# Patient Record
Sex: Male | Born: 1965 | Race: Black or African American | Hispanic: No | Marital: Single | Smoking: Never smoker
Health system: Southern US, Community
[De-identification: ages and names within clinical notes are randomized; demographics above are authoritative.]

## PROBLEM LIST (undated history)

## (undated) DIAGNOSIS — E785 Hyperlipidemia, unspecified: Secondary | ICD-10-CM

## (undated) DIAGNOSIS — E669 Obesity, unspecified: Secondary | ICD-10-CM

## (undated) DIAGNOSIS — I10 Essential (primary) hypertension: Secondary | ICD-10-CM

## (undated) HISTORY — PX: OTHER SURGICAL HISTORY: SHX169

## (undated) HISTORY — DX: Hyperlipidemia, unspecified: E78.5

## (undated) HISTORY — PX: EYE SURGERY: SHX253

## (undated) HISTORY — PX: APPENDECTOMY: SHX54

## (undated) HISTORY — DX: Essential (primary) hypertension: I10

## (undated) HISTORY — DX: Obesity, unspecified: E66.9

---

## 2003-09-06 ENCOUNTER — Emergency Department (HOSPITAL_COMMUNITY): Admission: EM | Admit: 2003-09-06 | Discharge: 2003-09-06 | Payer: Self-pay | Admitting: Emergency Medicine

## 2003-09-08 ENCOUNTER — Ambulatory Visit (HOSPITAL_COMMUNITY): Admission: RE | Admit: 2003-09-08 | Discharge: 2003-09-08 | Payer: Self-pay | Admitting: Orthopedic Surgery

## 2009-05-24 ENCOUNTER — Encounter: Admission: RE | Admit: 2009-05-24 | Discharge: 2009-07-03 | Payer: Self-pay | Admitting: Orthopedic Surgery

## 2009-09-18 ENCOUNTER — Ambulatory Visit: Payer: Self-pay | Admitting: Family Medicine

## 2009-09-18 DIAGNOSIS — I10 Essential (primary) hypertension: Secondary | ICD-10-CM | POA: Insufficient documentation

## 2009-09-19 DIAGNOSIS — E785 Hyperlipidemia, unspecified: Secondary | ICD-10-CM | POA: Insufficient documentation

## 2009-09-20 LAB — CONVERTED CEMR LAB
ALT: 29 units/L (ref 0–53)
AST: 23 units/L (ref 0–37)
Albumin: 4.5 g/dL (ref 3.5–5.2)
Alkaline Phosphatase: 60 units/L (ref 39–117)
BUN: 9 mg/dL (ref 6–23)
CO2: 26 meq/L (ref 19–32)
Calcium: 9.5 mg/dL (ref 8.4–10.5)
Chloride: 102 meq/L (ref 96–112)
Cholesterol: 266 mg/dL — ABNORMAL HIGH (ref 0–200)
Creatinine, Ser: 0.98 mg/dL (ref 0.40–1.50)
Glucose, Bld: 91 mg/dL (ref 70–99)
HDL: 32 mg/dL — ABNORMAL LOW (ref >39)
LDL Cholesterol: 182 mg/dL — ABNORMAL HIGH (ref 0–99)
Potassium: 4.3 meq/L (ref 3.5–5.3)
Sodium: 140 meq/L (ref 135–145)
Total Bilirubin: 0.6 mg/dL (ref 0.3–1.2)
Total CHOL/HDL Ratio: 8.3
Total Protein: 7.2 g/dL (ref 6.0–8.3)
Triglycerides: 260 mg/dL — ABNORMAL HIGH (ref <150)
VLDL: 52 mg/dL — ABNORMAL HIGH (ref 0–40)

## 2009-10-13 ENCOUNTER — Telehealth: Payer: Self-pay | Admitting: Family Medicine

## 2009-12-25 ENCOUNTER — Ambulatory Visit: Payer: Self-pay | Admitting: Family Medicine

## 2009-12-26 LAB — CONVERTED CEMR LAB
ALT: 23 units/L (ref 0–53)
AST: 32 units/L (ref 0–37)
Direct LDL: 117 mg/dL — ABNORMAL HIGH

## 2010-01-08 ENCOUNTER — Ambulatory Visit: Payer: Self-pay | Admitting: Family Medicine

## 2010-03-26 ENCOUNTER — Ambulatory Visit: Payer: Self-pay | Admitting: Family Medicine

## 2010-03-26 DIAGNOSIS — K644 Residual hemorrhoidal skin tags: Secondary | ICD-10-CM | POA: Insufficient documentation

## 2010-04-16 ENCOUNTER — Ambulatory Visit: Payer: Self-pay | Admitting: Family Medicine

## 2010-04-17 LAB — CONVERTED CEMR LAB
ALT: 22 units/L (ref 0–53)
AST: 19 units/L (ref 0–37)
Albumin: 4.7 g/dL (ref 3.5–5.2)
Alkaline Phosphatase: 53 units/L (ref 39–117)
BUN: 11 mg/dL (ref 6–23)
CO2: 25 meq/L (ref 19–32)
Calcium: 9.8 mg/dL (ref 8.4–10.5)
Chloride: 104 meq/L (ref 96–112)
Cholesterol: 227 mg/dL — ABNORMAL HIGH (ref 0–200)
Creatinine, Ser: 0.86 mg/dL (ref 0.40–1.50)
Glucose, Bld: 97 mg/dL (ref 70–99)
HDL: 40 mg/dL (ref >39)
LDL Cholesterol: 166 mg/dL — ABNORMAL HIGH (ref 0–99)
Potassium: 4.7 meq/L (ref 3.5–5.3)
Sodium: 140 meq/L (ref 135–145)
Total Bilirubin: 0.7 mg/dL (ref 0.3–1.2)
Total CHOL/HDL Ratio: 5.7
Total Protein: 7.6 g/dL (ref 6.0–8.3)
Triglycerides: 106 mg/dL (ref <150)
VLDL: 21 mg/dL (ref 0–40)

## 2010-07-02 ENCOUNTER — Telehealth: Payer: Self-pay | Admitting: Family Medicine

## 2010-09-11 ENCOUNTER — Telehealth: Payer: Self-pay | Admitting: Family Medicine

## 2010-10-05 ENCOUNTER — Telehealth (INDEPENDENT_AMBULATORY_CARE_PROVIDER_SITE_OTHER): Payer: Self-pay | Admitting: *Deleted

## 2010-10-11 NOTE — Assessment & Plan Note (Signed)
Summary: HTN/ high chol   Vital Signs:  Patient profile:   45 year old male Height:      73 inches Weight:      292 pounds BMI:     38.66 O2 Sat:      98 % on Room air Pulse rate:   67 / minute BP sitting:   151 / 91  (left arm) Cuff size:   large  Vitals Entered By: Payton Spark CMA (December 25, 2009 9:14 AM)  O2 Flow:  Room air CC: F/U BP.    Primary Care Provider:  Seymour Bars DO  CC:  F/U BP. Marland Kitchen  History of Present Illness: 43 yr BM presents for F/U HTN and Hyperlipidemia.  He has lost 10 lbs since 09/2009. He is working out 2hrs/day 5 days/wk with a Psychologist, educational. He is eating a high protein, low carb diet. He is taking Norvasc and Simvastatin almost daily. He forgot Norvasc today. He is taking a caffeine supplement daily before he exercises.  Denies CP, SOB. No HA, N/V.   He is due for f/u labs with new start statin 3 mos ago.     Current Medications (verified): 1)  Simvastatin 20 Mg Tabs (Simvastatin) .Marland Kitchen.. 1 Tab By Mouth Qhs 2)  Norvasc 5 Mg Tabs (Amlodipine Besylate) .Marland Kitchen.. 1 Tab By Mouth Daily 3)  Oxy Elite Pro  Allergies (verified): No Known Drug Allergies  Past History:  Past Medical History: HTN obesity high cholesterol  Past Surgical History: Reviewed history from 09/18/2009 and no changes required. appy at age 88 patellar tendon surgery, bilat  ortho Dr Charise Killian  Family History: Reviewed history from 09/18/2009 and no changes required. father died of pancreatic cancer at 65 mother healthy brother HTN uncle ESRD, DM cousin DM  Social History: Reviewed history from 09/18/2009 and no changes required. 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 Married with a 90 yo daughter. Never smoked.  Denies ETOH. Wants to start exercising.  Review of Systems      See HPI  Physical Exam  General:  alert, well-developed, well-nourished, and well-hydrated.   Head:  normocephalic and atraumatic.   Eyes:  pupils equal, pupils round, and pupils reactive to  light.   Nose:  no nasal discharge.   Mouth:  good dentition and pharynx pink and moist.   Neck:  no masses.   Lungs:  Normal respiratory effort, chest expands symmetrically. Lungs are clear to auscultation, no crackles or wheezes. Heart:  normal rate, regular rhythm, and no murmur.   Pulses:  R radial normal and L radial normal.   Extremities:  no LE edema Skin:  turgor normal, color normal, and no rashes.   Psych:  normally interactive, good eye contact, not anxious appearing, and not depressed appearing.     Impression & Recommendations:  Problem # 1:  ESSENTIAL HYPERTENSION, BENIGN (ICD-401.1)  BP today 151/91 (off meds). Reinforced take Norvasc everyday. Nurse visit in 2 weeks to check BP.  F/u 4 months.  His updated medication list for this problem includes:    Norvasc 5 Mg Tabs (Amlodipine besylate) .Marland Kitchen... 1 tab by mouth daily  BP today: 151/91 Prior BP: 143/84 (09/18/2009)  Labs Reviewed: K+: 4.3 (09/18/2009) Creat: : 0.98 (09/18/2009)   Chol: 266 (09/18/2009)   HDL: 32 (09/18/2009)   LDL: 182 (09/18/2009)   TG: 260 (09/18/2009)  Problem # 2:  HYPERLIPIDEMIA (ICD-272.4)  Labs today, will call results tomorrow.  Continuce Simvastatin daily at night.  Keep working on wt  loss, healthy diet and exercise.  10# wt loss is a great start!  His updated medication list for this problem includes:    Simvastatin 20 Mg Tabs (Simvastatin) .Marland Kitchen... 1 tab by mouth qhs  Orders: T-ALT/SGPT ([address]) T-AST/SGOT ([address]-91478) T-Lipoprotein (LDL cholesterol)  ([address]-13086)  Complete Medication List: 1)  Simvastatin 20 Mg Tabs (Simvastatin) .Marland Kitchen.. 1 tab by mouth qhs 2)  Norvasc 5 Mg Tabs (Amlodipine besylate) .Marland Kitchen.. 1 tab by mouth daily 3)  Oxy Elite Pro   Patient Instructions: 1)  Return in 2 wks for a nurse BP check. 2)  Take Norvasc 5 mg every NIGHT with the Simvastatin at NIGHT. 3)  Labs today. 4)  Will call you w/ results tomorrow and will adjust cholesterol medicine if  needed. 5)  Keep working on [employer] and exercise. 6)  Follow up in 4 mos.

## 2010-10-11 NOTE — Assessment & Plan Note (Signed)
Summary: BP check  Nurse Visit   Vital Signs:  Patient profile:   45 year old male O2 Sat:      99 % on Room air Pulse rate:   62 / minute BP sitting:   157 / 98  (right arm) Cuff size:   large  Vitals Entered By: Payton Spark CMA (Jan 08, 2010 9:16 AM)  O2 Flow:  Room air  Allergies: No Known Drug Allergies  Orders Added: 1)  Est. Patient Level I [[address]] Prescriptions: NORVASC 10 MG TABS (AMLODIPINE BESYLATE) 1 tab by mouth daily  #30 x 1   Entered and Authorized by:   Seymour Bars DO   Signed by:   Seymour Bars DO on 01/08/2010   Method used:   Electronically to        [facility]* (retail)       [address] [place], [place]  [address]       Ph: [phone]       Fax: [phone]   RxID:   [id number]    CC: Follow-up HTN HPI: Taking meds? yes Side effects? no  Chest pain, SOB, Dizziness? no  A/P: HTN (401.1) At goal? If no, Patient will be notified. Pt states he has been taking ABX for sinusitis x 4 days. I explained to Pt that this would not usually cause elevation in BP.  5 minutes was spent with patient.   Impression & Recommendations:  Problem # 1:  ESSENTIAL HYPERTENSION, BENIGN (ICD-401.1) Assessment Deteriorated BP still high.  Increase NORVASC to 10 mg once a day (he can double up his 5 mg tabs until he picks up new RX that I sent to his pharmacy today).  RTC for a nurse BP check in 4 wks.   His updated medication list for this problem includes:    Norvasc 10 Mg Tabs (Amlodipine besylate) .Marland Kitchen... 1 tab by mouth daily    BP today: 157/98 Prior BP: 151/91 (12/25/2009)  Labs Reviewed: K+: 4.3 (09/18/2009) Creat: : 0.98 (09/18/2009)   Chol: 266 (09/18/2009)   HDL: 32 (09/18/2009)   LDL: 182 (09/18/2009)   TG: 260 (09/18/2009)  Complete Medication List: 1)  Simvastatin 20 Mg Tabs (Simvastatin) .Marland Kitchen.. 1 tab by mouth qhs 2)  Norvasc 10 Mg Tabs (Amlodipine besylate) .Marland Kitchen.. 1 tab by mouth daily 3)  Oxy Elite Pro    Appended Document:  BP check LMOM for Pt to CB.Arvilla Market CMA, Michelle Jan 08, 2010 9:33 AM   Pt aware of the above.Arvilla Market CMA, Michelle Jan 08, 2010 9:37 AM

## 2010-10-11 NOTE — Assessment & Plan Note (Signed)
Summary: hemorrhoids   Vital Signs:  Patient profile:   45 year old male Height:      73 inches Weight:      279 pounds BMI:     36.94 O2 Sat:      99 % on Room air Pulse rate:   61 / minute BP sitting:   137 / 78  (left arm) Cuff size:   large  Vitals Entered By: Payton Spark CMA (March 26, 2010 11:40 AM)  O2 Flow:  Room air CC: ? hemorrhoids x 2 weeks.   Primary Care Provider:  Seymour Bars DO  CC:  ? hemorrhoids x 2 weeks.Marland Kitchen  History of Present Illness: 45 yo AAM presents for hemorrhoid that started 2 wks ago.  He had them years ago but didn't bother him for years.  He has felt an exernal buldge.  He has no acute pain but has some irritation and itching after BMs.  He denies chronic constipation but had been sitting a lot.  Denies any straining with BMs.  Has seen bright red blood in his stool.  Had a normal colonoscopy 2 yrs ago.    Allergies: No Known Drug Allergies  Past History:  Past Medical History: Reviewed history from 12/25/2009 and no changes required. HTN obesity high cholesterol  Past Surgical History: Reviewed history from 09/18/2009 and no changes required. appy at age 40 patellar tendon surgery, bilat  ortho Dr Charise Killian  Social History: Reviewed history from 09/18/2009 and no changes required. 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 Never smoked.  Denies ETOH. Wants to start exercising.  Review of Systems      See HPI  Physical Exam  General:  alert, well-developed, well-nourished, and well-hydrated.   Rectal:  2 moderate sized protruding. slightly hard but not thrombosed external hemorrhoids w/o active bleeding.  nontender.   Impression & Recommendations:  Problem # 1:  EXTERNAL HEMORRHOIDS (ICD-455.3) Non -bleeding and not actively painful moderate sized (2) external hemorrhoids.  Recurrent. Will try conservative treatment with Anamantle two times a day x 2 wks, Sitz Baths, high fiber diet + water and  Colace to keep stools soft, avoiding power lifting and prolonged sitting.  Call if not improved in 3 wks for a referral to gen surgery.  Complete Medication List: 1)  Simvastatin 20 Mg Tabs (Simvastatin) .Marland Kitchen.. 1 tab by mouth qhs 2)  Norvasc 10 Mg Tabs (Amlodipine besylate) .Marland Kitchen.. 1 tab by mouth daily 3)  Oxy Elite Pro  4)  Anamantle Hc 3-0.5 % Crea (Lidocaine-hydrocortisone ace) .... Apply cream to hemorrhoids bid  Patient Instructions: 1)  For hemorrhoids: 2)  Use Anamantle cream 2 x a day for the next 2 wks. 3)  Use COLACE (stool softener) 1-2 x a day to keep BMs soft. 4)  Eat a high fiber diet, plenty of water. 5)  Avoid squats and prolonged sitting. 6)  Use moist wipes with each BM. 7)  Soak bottom in warm bath water 15 min each night. 8)  Call if hemorrhoids have not irmproved in 3 wks. Prescriptions: ANAMANTLE HC 3-0.5 % CREA (LIDOCAINE-HYDROCORTISONE ACE) apply cream to hemorrhoids bid  #1 box x 1   Entered and Authorized by:   Seymour Bars DO   Signed by:   Seymour Bars DO on 03/26/2010   Method used:   Electronically to        [facility]* (retail)       [address]  [place], [place]  [address]       Ph: [phone]       Fax: [phone]   RxID:   [id number]   Appended Document: hemorrhoids Pharm called stating anamantle is no longer available and would like to know if they can change to similar kit. I called pharm back and OK'd the change.

## 2010-10-11 NOTE — Progress Notes (Signed)
Summary: SIDE EFFECTS FROM BP MED  Phone Note Call from Patient   Caller: Patient Summary of Call: PT CALLED IN REF TO BP MED HAVING SIDE EFFECTS/ CAUSIING ERECTILE DYSFUNCTION. TAKING HCTZ 25 MG.  I USE [facility]  [place] Initial call taken by: Kandice Hams,  October 13, 2009 4:49 PM  Follow-up for Phone Call        I am going to change him to Norvasc 5 mg once daily.  He is to stop the HCTZ and call if ED has not improved in 3-4 wks. Follow-up by: Seymour Bars DO,  October 13, 2009 8:10 PM    New/Updated Medications: NORVASC 5 MG TABS (AMLODIPINE BESYLATE) 1 tab by mouth daily Prescriptions: NORVASC 5 MG TABS (AMLODIPINE BESYLATE) 1 tab by mouth daily  #30 x 2   Entered and Authorized by:   Seymour Bars DO   Signed by:   Seymour Bars DO on 10/13/2009   Method used:   Electronically to        [facility]* (retail)       [address] [place], [place]  [address]       Ph: [phone]       Fax: [phone]   RxID:   [id number]   Appended Document: SIDE EFFECTS FROM BP MED Tried to call Pt at the above #s did not get an answer or machine to LM.

## 2010-10-11 NOTE — Progress Notes (Signed)
Summary: Meds  Phone Note From Pharmacy   Caller: Avie Arenas [phone] Call For: Bowen  Summary of Call: Pt on Simvastatin 40mg  and Amlodipine. States guidelines now say pt should not be on no more than 20mg  of Simvastatin with the Amlodipine. Please advise Initial call taken by: Kathlene November LPN,  July 02, 2010 2:18 PM  Follow-up for Phone Call        the guidelines actually say to not use the 80 mg simvastatin dose with amlodopine but if he wants to change (because 20 mg was not enough), I will change him to Crestor. Follow-up by: Seymour Bars DO,  July 02, 2010 2:26 PM     Appended Document: Meds He said it was up you- its what you want to do he wanted to just make you aware of it  Appended Document: Meds I changed his Simvastatin to Crestor because it works better with less side effect, does not interact with his amlodopine and is covered by his insurance.  New RX sent to pharmacy.  Recheck LDL in 3 - 4 mos.  Seymour Bars, D.O.  Appended Document: Meds Pt notified of MD instructions. He needed a 90 day supply sent due to loosing insurance at end of month so resent new rx for 90 day supply to pharmacy. KJ LPN

## 2010-10-11 NOTE — Assessment & Plan Note (Signed)
Summary: f/u HTN/ lipids   Vital Signs:  Patient profile:   45 year old male Height:      73 inches Weight:      276 pounds BMI:     36.55 O2 Sat:      100 % on Room air Pulse rate:   55 / minute BP sitting:   130 / 71  (left arm) Cuff size:   large  Vitals Entered By: Payton Spark CMA (April 16, 2010 9:08 AM)  O2 Flow:  Room air CC: F/U    Primary Care Provider:  Seymour Bars DO  CC:  F/U .  History of Present Illness: 45 yo AAM presents for f/u HTN and high cholesterol.  He is getting exercise - going to the gym everyday.  Denies CP or DOE.  He is not a smoker.  Due for fasting labs.  Doing well on Norvasc and Simvastatin.    Current Medications (verified): 1)  Simvastatin 20 Mg Tabs (Simvastatin) .Marland Kitchen.. 1 Tab By Mouth Qhs 2)  Norvasc 10 Mg Tabs (Amlodipine Besylate) .Marland Kitchen.. 1 Tab By Mouth Daily 3)  Oxy Elite Pro  Allergies (verified): No Known Drug Allergies  Past History:  Past Medical History: Reviewed history from 12/25/2009 and no changes required. HTN obesity high cholesterol  Past Surgical History: Reviewed history from 09/18/2009 and no changes required. appy at age 57 patellar tendon surgery, bilat  ortho Dr Charise Killian  Social History: Reviewed history from 09/18/2009 and no changes required. 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 Never smoked.  Denies ETOH. Wants to start exercising.  Review of Systems General:  Denies fatigue. Eyes:  Denies blurring. CV:  Denies chest pain or discomfort, palpitations, shortness of breath with exertion, and swelling of feet. Neuro:  Denies headaches.  Physical Exam  General:  alert, well-developed, well-nourished, and well-hydrated.   Head:  normocephalic and atraumatic.   Eyes:  pupils equal, pupils round, and pupils reactive to light.   Mouth:  good dentition and pharynx pink and moist.   Neck:  no masses.   Lungs:  Normal respiratory effort, chest expands symmetrically.  Lungs are clear to auscultation, no crackles or wheezes. Heart:  normal rate, regular rhythm, and no murmur.   Pulses:  R radial normal and L radial normal.   Extremities:  no LE edema Skin:  color normal.   Cervical Nodes:  No lymphadenopathy noted Psych:  good eye contact, not anxious appearing, and not depressed appearing.     Impression & Recommendations:  Problem # 1:  ESSENTIAL HYPERTENSION, BENIGN (ICD-401.1) BP at goal.  Update labs and continue meds+ healthy diet and regular exercise. His updated medication list for this problem includes:    Norvasc 10 Mg Tabs (Amlodipine besylate) .Marland Kitchen... 1 tab by mouth daily  Orders: T-Comprehensive Metabolic Panel ([address]) Ophthalmology Referral (Ophthalmology)  BP today: 130/71 Prior BP: 137/78 (03/26/2010)  Labs Reviewed: K+: 4.3 (09/18/2009) Creat: : 0.98 (09/18/2009)   Chol: 266 (09/18/2009)   HDL: 32 (09/18/2009)   LDL: 182 (09/18/2009)   TG: 260 (09/18/2009)  Problem # 2:  HYPERLIPIDEMIA (ICD-272.4) Update labs and adjust Simvastatin accordingly. His updated medication list for this problem includes:    Simvastatin 20 Mg Tabs (Simvastatin) .Marland Kitchen... 1 tab by mouth qhs  Orders: T-Lipid Profile ([address])  Labs Reviewed: SGOT: 32 (12/25/2009)   SGPT: 23 (12/25/2009)   HDL:32 (09/18/2009)  LDL:182 (09/18/2009)  Chol:266 (09/18/2009)  Trig:260 (09/18/2009)  Complete Medication List:  1)  Simvastatin 20 Mg Tabs (Simvastatin) .Marland Kitchen.. 1 tab by mouth qhs 2)  Norvasc 10 Mg Tabs (Amlodipine besylate) .Marland Kitchen.. 1 tab by mouth daily 3)  Triamcinolone Acetonide 0.5 % Crea (Triamcinolone acetonide) .... Apply to rash two times a day x 10 days  Patient Instructions: 1)  Labs today. 2)  will call you w/ results tomorrow. 3)  Stay on current meds. 4)  Will RF you when due. 5)  Keep up the good work with healthy diet and regular exercise. 6)  Will set you up with [facility] for a dilated eye exam.   7)  Return for a PHYSICAL in  6 mos. Prescriptions: TRIAMCINOLONE ACETONIDE 0.5 % CREA (TRIAMCINOLONE ACETONIDE) apply to rash two times a day x 10 days  #15 g x 0   Entered and Authorized by:   Seymour Bars DO   Signed by:   Seymour Bars DO on 04/16/2010   Method used:   Electronically to        [facility]* (retail)       [address] [place], [place]  [address]       Ph: [phone]       Fax: [phone]   RxID:   [id number]

## 2010-10-11 NOTE — Progress Notes (Signed)
Summary: Change meds back  Phone Note Call from Patient Call back at Home Phone [phone]   Caller: Patient Summary of Call: Pt states he would like to go back on the cholestrol & bp med that he used to be on, he does not feel like the new ones are as good, doesn't need a refill yet Initial call taken by: Lannette Donath,  October 05, 2010 2:28 PM  Follow-up for Phone Call        Advised Pt to discuss at F/U Follow-up by: Payton Spark CMA,  October 05, 2010 2:32 PM

## 2010-10-11 NOTE — Progress Notes (Signed)
Summary: Needs BP and CHolesterol med changed due to cost  Phone Note Call from Patient Call back at [phone]   Caller: Patient Call For: Seymour Bars DO Summary of Call: Pt calls and states has no insurance and wanted to know if you would send to [facility] on the 4.00 list a med for his BP and cholesterol. Initial call taken by: Kathlene November LPN,  September 11, 2010 9:05 AM  Follow-up for Phone Call        changed both but they may not work quite the same.  Have him f/u with me for BP and LDL in 6 wks.   Follow-up by: Seymour Bars DO,  September 11, 2010 9:09 AM    New/Updated Medications: PRAVASTATIN SODIUM 40 MG TABS (PRAVASTATIN SODIUM) 1 tab by mouth qhs ENALAPRIL MALEATE 10 MG TABS (ENALAPRIL MALEATE) 1 tab by mouth daily Prescriptions: PRAVASTATIN SODIUM 40 MG TABS (PRAVASTATIN SODIUM) 1 tab by mouth qhs  #30 x 2   Entered and Authorized by:   Seymour Bars DO   Signed by:   Seymour Bars DO on 09/11/2010   Method used:   Electronically to        [facility]* (retail)       [address] [place], [place]  [address]       Ph: [phone]       Fax: [phone]   RxID:   [id number] ENALAPRIL MALEATE 10 MG TABS (ENALAPRIL MALEATE) 1 tab by mouth daily  #30 x 2   Entered and Authorized by:   Seymour Bars DO   Signed by:   Seymour Bars DO on 09/11/2010   Method used:   Electronically to        [facility]* (retail)       [address] [place], [place]  [address]       Ph: [phone]       Fax: [phone]   RxID:   [id number]   Appended Document: Needs BP and CHolesterol med changed due to cost 09/11/2010- Pt notified med sent and to followup in 6 weeks. KJ LPN

## 2010-10-11 NOTE — Assessment & Plan Note (Signed)
Summary: NOV CPE/ HTN   Vital Signs:  Patient profile:   45 year old male Height:      73 inches Weight:      302 pounds BMI:     39.99 O2 Sat:      99 % on Room air Temp:     98.4 degrees F oral Pulse rate:   73 / minute BP sitting:   143 / 84  (left arm) Cuff size:   large  Vitals Entered By: Payton Spark CMA (September 18, 2009 1:35 PM)  O2 Flow:  Room air CC: New to est. CPE   Primary Care Provider:  Seymour Bars DO  CC:  New to est. CPE.  History of Present Illness: 45 yo AAM presents for NOV/ CPE.    Hx of HTN.  Last CPE about 3 yrs ago.  Was on HCTZ in the past.  His weight is as his max.  He wants to start exercising again.    He has a strong fam hx of HTN but no fam hx of premature heart dz.  No fam hx of prostate or colon cancer.    Due for labs.    Denies CP or DOE.  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 Never smoked.  Denies ETOH. Wants to start exercising.  Review of Systems       no fevers/sweats/weakness, unexplained wt loss/gain, no change in vision, no difficulty hearing, ringing in ears, no hay fever/allergies, no CP/discomfort, no palpitations, no breast lump/nipple discharge, no cough/wheeze, no blood in stool,no  N/V/D, no nocturia, no leaking urine, no unusual vag bleeding, no vaginal/penile discharge, no muscle/joint pain, no rash, no new/changing mole, no HA, no memory loss, no anxiety, no sleep problem, no depression, no unexplained lumps, no easy bruising/bleeding, no concern with sexual function   Physical Exam  General:  obese AAM in  NAD Head:  [place]/AT Eyes:  pupils equal, pupils round, and pupils reactive to light.   Ears:  EACs patent; TMs translucent and gray with good cone of light and bony landmarks.  Nose:  boggy turbinates w/o rhinorrhea Mouth:  throat w/o injection with cobblestoning Neck:  short thick neck Lungs:  Normal respiratory effort, chest expands symmetrically. Lungs are clear to auscultation, no crackles or wheezes. Heart:  Normal rate and regular rhythm. S1 and S2 normal without gallop, murmur, click, rub or other extra sounds. Abdomen:  Bowel sounds positive,abdomen soft and non-tender without masses, organomegaly Msk:  no joint tenderness, no joint swelling, and no joint warmth.   Pulses:  2+ radial and pedal pulses Extremities:  bilat pes planus Neurologic:  gait normal.   Skin:  color normal.  acne scarring on face multiple tatooes Cervical Nodes:  No lymphadenopathy noted Psych:  good eye contact, not anxious appearing, and not depressed appearing.     Impression & Recommendations:  Problem # 1:  HEALTH SCREENING (ICD-V70.0) Keeping healthy checklist for men reviewed. BP high.  Restart HCTZ 25 mg once daily. EKG today shows NSR at 68 bpm with 1st deg AVB with a PR of 228 msec.  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 Tetanus UTD.  Update fasting labs today. DASH diet info given. BMI 39 c/w class II obesity. Needs to work on diet, exercise, wt loss.  RTC 3 mos for f/u BP/ wt.  Complete Medication List: 1)  Hydrochlorothiazide 25 Mg Tabs (Hydrochlorothiazide) .Marland Kitchen.. 1 tab by mouth daily  Other Orders: T-Comprehensive Metabolic Panel [id number]) T-Lipid Profile ([id number]) EKG w/ Interpretation ([address])  Patient Instructions: 1)  Start HCTZ 25 mg once daily for High Blood pressure. 2)  EKG today. 3)  Start exercising 1 hr 4-5 days/ wk. 4)  Work on low sodium diet.  See DASH diet handout. 5)  Labs today. 6)  Will call you w/ results tomorrow. 7)  REturn for f/u weight/ BP in 3  months. Prescriptions: HYDROCHLOROTHIAZIDE 25 MG TABS (HYDROCHLOROTHIAZIDE) 1 tab by mouth daily  #30 x 3   Entered and Authorized by:   Seymour Bars DO   Signed by:   Seymour Bars DO on 09/18/2009   Method used:   Electronically to        [facility] (retail)       [address] [place], [place]  [address]       Ph: [phone]       Fax: [phone]   RxID:   [id number]

## 2010-10-22 ENCOUNTER — Ambulatory Visit: Payer: Self-pay | Admitting: Family Medicine

## 2010-11-12 ENCOUNTER — Encounter: Payer: Self-pay | Admitting: Family Medicine

## 2010-11-12 ENCOUNTER — Ambulatory Visit (INDEPENDENT_AMBULATORY_CARE_PROVIDER_SITE_OTHER): Payer: BC Managed Care – PPO | Admitting: Family Medicine

## 2010-11-12 DIAGNOSIS — I10 Essential (primary) hypertension: Secondary | ICD-10-CM

## 2010-11-12 DIAGNOSIS — E785 Hyperlipidemia, unspecified: Secondary | ICD-10-CM

## 2010-11-12 DIAGNOSIS — E669 Obesity, unspecified: Secondary | ICD-10-CM | POA: Insufficient documentation

## 2010-11-13 LAB — CONVERTED CEMR LAB
ALT: 21 units/L (ref 0–53)
AST: 27 units/L (ref 0–37)
Albumin: 4.6 g/dL (ref 3.5–5.2)
Alkaline Phosphatase: 49 units/L (ref 39–117)
BUN: 13 mg/dL (ref 6–23)
CO2: 23 meq/L (ref 19–32)
Calcium: 9.3 mg/dL (ref 8.4–10.5)
Chloride: 103 meq/L (ref 96–112)
Creatinine, Ser: 1 mg/dL (ref 0.40–1.50)
Direct LDL: 139 mg/dL — ABNORMAL HIGH
Glucose, Bld: 86 mg/dL (ref 70–99)
Potassium: 4.7 meq/L (ref 3.5–5.3)
Sodium: 136 meq/L (ref 135–145)
Total Bilirubin: 0.5 mg/dL (ref 0.3–1.2)
Total Protein: 6.9 g/dL (ref 6.0–8.3)

## 2010-11-20 NOTE — Assessment & Plan Note (Signed)
Summary: f/u BP/ cholesterol   Vital Signs:  Patient profile:   45 year old male Height:      73 inches Weight:      280 pounds BMI:     37.08 O2 Sat:      96 % on Room air Pulse rate:   55 / minute BP sitting:   148 / 87  (left arm) Cuff size:   large  Vitals Entered By: Payton Spark CMA (November 12, 2010 9:26 AM)  O2 Flow:  Room air CC: F/U BP   Primary Care Provider:  Seymour Bars DO  CC:  F/U BP.  History of Present Illness: 45 year old male presents for followup on his blood pressure.  He has not taken his blood pressure since starting the enalapril.  He was switched to Enalapril from Amlodipine due to insurance coverage but now has gone back on his original insurance.  He would like to switch back to the norvasc today.  He continues to try and exercise daily as well as limit his salt intake in his diet.  He denies headaches, chest pains, palpitations, shortness of breath, dizziness, feeling light-headed or changes in vision.  He also had to change his Crestor to Pravastatin due to cost which he is taking 'sometimes'.  He is due to have his LDL rechecked with LFTs.  He would like to change back to Crestor which change back of insurance.    Current Medications (verified): 1)  Pravastatin Sodium 40 Mg Tabs (Pravastatin Sodium) .Marland Kitchen.. 1 Tab By Mouth Qhs 2)  Enalapril Maleate 10 Mg Tabs (Enalapril Maleate) .Marland Kitchen.. 1 Tab By Mouth Daily  Allergies (verified): No Known Drug Allergies  Past History:  Past Medical History: Reviewed history from 12/25/2009 and no changes required. HTN obesity high cholesterol  Past Surgical History: Reviewed history from 09/18/2009 and no changes required. appy at age 51 patellar tendon surgery, bilat  ortho Dr Charise Killian  Family History: Reviewed history from 09/18/2009 and no changes required. father died of pancreatic cancer at 70 mother healthy brother HTN uncle ESRD, DM cousin DM  Social History: Reviewed history from 09/18/2009  and no changes required. 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 Married with a 6 yo daughter. Never smoked.  Denies ETOH. Wants to start exercising.  Review of Systems General:  Denies fatigue. Eyes:  Denies blurring. CV:  Denies chest pain or discomfort, palpitations, shortness of breath with exertion, and swelling of feet. GU:  Denies erectile dysfunction.  Physical Exam  General:  alert, well-developed, and well-nourished.  obese Head:  normocephalic and atraumatic.   Eyes:  pupils equal, pupils round, and pupils reactive to light.  Conjunctiva clear. Mouth:  Oropharynx pink and moist without exudate.   Lungs:  normal respiratory effort, normal breath sounds, no crackles, and no wheezes.   Heart:  normal rate and regular rhythm.   Pulses:  R radial normal and L radial normal.     Impression & Recommendations:  Problem # 1:  ESSENTIAL HYPERTENSION, BENIGN (ICD-401.1) BP high on Enalapril and pt felt better on Amlodopine which is now covered once again by insurance so will switch back to previous (10 mg) dose of Amlodopine daily.  Update fasting labs.  f/u for CPE this summer. His updated medication list for this problem includes:    Amlodipine Besylate 10 Mg Tabs (Amlodipine besylate) .Marland Kitchen... 1 tab by mouth once daily  Orders: T-Comprehensive Metabolic Panel ([id number])  Problem # 2:  HYPERLIPIDEMIA (ICD-272.4) Change  back to Crestor 10 mg at bedtime given insurance change and preivous high reading LDL.  Check labs for starting point today and f/u results.    His updated medication list for this problem includes:    Crestor 10 Mg Tabs (Rosuvastatin calcium) .Marland Kitchen... 1 tab by mouth at bedtime  Orders: T-LDL Direct ([id number])  Problem # 3:  OBESITY, CLASS II (ICD-278.00) BMI 37 c/w class II obesity. Counseled on healthy diet, regular exercise, wt loss which is he working on.  Complete Medication List: 1)  Crestor 10 Mg Tabs (Rosuvastatin calcium) .Marland Kitchen.. 1 tab by mouth  at bedtime 2)  Amlodipine Besylate 10 Mg Tabs (Amlodipine besylate) .Marland Kitchen.. 1 tab by mouth once daily  Patient Instructions: 1)  Labs downstairs today. 2)  Change Enalapril back to Amlodopine 10 mg once daily and change Pravastatin back to Crestor 10 mg at night for cholesterol. 3)  Call if any problems. 4)  Work on [employer], regular exercise. 5)  REturn for a PHYSICAL in 3-4 mos. Prescriptions: CRESTOR 10 MG TABS (ROSUVASTATIN CALCIUM) 1 tab by mouth at bedtime  #30 x 3   Entered and Authorized by:   Seymour Bars DO   Signed by:   Seymour Bars DO on 11/12/2010   Method used:   Electronically to        [facility]* (retail)       [address] [place], [place]  [address]       Ph: [phone]       Fax: [phone]   RxID:   [id number] AMLODIPINE BESYLATE 10 MG TABS (AMLODIPINE BESYLATE) 1 tab by mouth once daily  #30 x 3   Entered and Authorized by:   Seymour Bars DO   Signed by:   Seymour Bars DO on 11/12/2010   Method used:   Electronically to        [facility]* (retail)       [address] [place], [place]  [address]       Ph: [phone]       Fax: [phone]   RxID:   [id number]    Orders Added: 1)  T-Comprehensive Metabolic Panel [[id number]] 2)  T-LDL Direct [[id number]] 3)  Est. Patient Level III [25427]

## 2011-01-25 NOTE — Op Note (Signed)
NAMEBEREL, NAJJAR                            ACCOUNT NO.:  [id number]   MEDICAL RECORD NO.:  [id number]                   PATIENT TYPE:  AMB   LOCATION:  DAY                                  FACILITY:  [facility]   PHYSICIAN:  Myrtie Neither, M.D.                 DATE OF BIRTH:  08-Jun-1966   DATE OF ADMISSION:  09/08/2003  DATE OF DISCHARGE:                                 OPERATIVE REPORT   PREOPERATIVE DIAGNOSIS:  Patellar tendon rupture, left knee.   POSTOPERATIVE DIAGNOSES:  1. Patellar tendon rupture, left knee.  2. Retinacular rupture, medial and lateral, left knee.   ANESTHESIA:  General.   PROCEDURES:  1. Repair of patellar tendon.  2. Repair of retinaculum, left knee.   The patient was taken to the operating room after given adequate  preoperative medication and given general anesthesia and intubated.  The  left knee was prepped with Duraprep and draped in a sterile manner.  Tourniquet and Bovie used for hemostasis.  A midline anterior incision made  over the left knee going through the skin and subcutaneous tissue from the  tibial tuberosity up to the superior pole of the patella.  Sharp and blunt  dissection was done down to the retinaculum.  The retinaculum was split both  medially and laterally, complete rupture of the patellar tendon was noted.  The articular surface of the joint itself was well-preserved.  Irrigation  with antibiotic solution was done, followed by repair of the medial and  lateral retinaculum with 2-0 wire suture.  Next the patellar tendon itself  was reapproximated in the midsubstance tear with 2-0 wire suture.  Approximation was very good.  Thorough irrigation was done, hemostasis  obtained.  Vicryl 0 was used to overlap the fascia over the repair, followed  by 2-0 Vicryl for the subcutaneous and skin staples for the skin.  A  pressure dressing was applied.  Marcaine 0.25% 15 mL was injected into the  joint.  A knee immobilizer was applied.  The  patient tolerated the procedure  well and went to the recovery room in a stable and satisfactory condition.  The patient is being discharged home with Percocet 10/650 mg one q.4h.  p.r.n. for pain, ice packs to the left knee.  The patient is instructed to  keep the knee immobilizer on at all times, return to the office in 10 days.  The patient is being discharged today in satisfactory condition.                                               Myrtie Neither, M.D.    AC/MEDQ  D:  09/08/2003  T:  09/08/2003  Job:  [id number]

## 2011-01-25 NOTE — H&P (Signed)
Donald Olson, Donald Olson                            ACCOUNT NO.:  [id number]   MEDICAL RECORD NO.:  [id number]                   PATIENT TYPE:  AMB   LOCATION:  DAY                                  FACILITY:  [facility]   PHYSICIAN:  Myrtie Neither, M.D.                 DATE OF BIRTH:  Apr 14, 1966   DATE OF ADMISSION:  09/08/2003  DATE OF DISCHARGE:                                HISTORY & PHYSICAL   CHIEF COMPLAINT:  Painful swelling, left knee.   HISTORY OF PRESENT ILLNESS:  This is a 45 year old male who was out playing  basketball two days ago and came down from the rebound, sustained injury to  his left knee.  The patient went to [facility] emergency room and was found  to have ruptured left patellar tendon.  The patient was seen in the office  with same.  The patient denies any loss of sensation or numbness or  tingling, mainly pain and swelling of the left knee.  The patient was placed  in a knee immobilizer.  The patient returns today for surgical repair of the  patellar tendon.   PAST MEDICAL HISTORY:  No history of high blood pressure or diabetes.  The  patient had previous history of right patellar tendon repair 10 years ago.  The patient also had appendectomy done.   FAMILY HISTORY:  Noncontributory.   REVIEW OF SYSTEMS:  As in history of present illness.  No cardiac or  respiratory, no urinary or bowel symptoms.   SOCIAL HISTORY:  The patient lives alone.  He is a Paediatric nurse.  Denies use of  alcohol or smoking.   PHYSICAL EXAMINATION:  VITAL SIGNS:  Temperature 97.3, pulse 82,  respirations 18, blood pressure 161/101.  Weight 265.  Height 5 feet 11  inches.  HEENT:  Normocephalic.  Eyes:  Conjunctivae and sclerae clear.  NECK:  Supple.  CHEST:  Clear.  CARDIAC:  S1 and S2 are regular.  EXTREMITIES:  Left knee +3 effusion.  Patient unable to extend the left  knee.  Palpable gap at the patellar tendon to palpation.  Neurovascular  status intact.   X-rays reveal high riding of  the patella with effusion.   IMPRESSION:  Patellar tendon rupture, left knee.   PLAN:  Surgical repair, left patellar tendon.                                               Myrtie Neither, M.D.    AC/MEDQ  D:  09/08/2003  T:  09/08/2003  Job:  [id number]

## 2011-03-03 ENCOUNTER — Encounter: Payer: Self-pay | Admitting: Family Medicine

## 2011-03-04 ENCOUNTER — Ambulatory Visit (INDEPENDENT_AMBULATORY_CARE_PROVIDER_SITE_OTHER): Payer: BC Managed Care – PPO | Admitting: Family Medicine

## 2011-03-04 ENCOUNTER — Encounter: Payer: Self-pay | Admitting: Family Medicine

## 2011-03-04 DIAGNOSIS — E669 Obesity, unspecified: Secondary | ICD-10-CM

## 2011-03-04 DIAGNOSIS — Z0000 Encounter for general adult medical examination without abnormal findings: Secondary | ICD-10-CM

## 2011-03-04 DIAGNOSIS — E785 Hyperlipidemia, unspecified: Secondary | ICD-10-CM

## 2011-03-04 DIAGNOSIS — I10 Essential (primary) hypertension: Secondary | ICD-10-CM

## 2011-03-04 MED ORDER — CIPROFLOXACIN-HYDROCORTISONE 0.2-1 % OT SUSP: 3.0000 [drp] | Freq: Two times a day (BID) | OTIC | Status: DC

## 2011-03-04 MED ORDER — TELMISARTAN 40 MG PO TABS: 40.0000 mg | ORAL_TABLET | Freq: Every day | ORAL | Status: DC

## 2011-03-04 NOTE — Patient Instructions (Signed)
Stay on current meds.  Add Micardis 1 tab daily for high BP.  Use RX drops in R ear x 7 days.   Will set up stress test.  Return for nurse BP check in 3 wks.  Return for f/u visit with fasting labs in 3 mos.

## 2011-03-04 NOTE — Progress Notes (Signed)
  Subjective:    Patient ID: Donald Olson, male    DOB: 10-Jun-1966, 45 y.o.   MRN: [id number]  HPI 45 yo AAM presents for CPE.  He is doing well on Norvasc 10 mg/ day and Crestor at night.  His labs are UTD.  It has been > 5 yrs since his last stress test.  He has a hx of dietary and medication non compliance.  He has yet to really work on his diet or exercise routine.  Denies chest pain or DOE.  He is UTD with labs and Tdap vaccines.  Denies fam hx of colon or prostate cancer or of premature heart dz.  BP 152/84  Pulse 88  Ht 5\' 11"  (1.803 m)  Wt 280 lb (127.007 kg)  BMI 39.05 kg/m2  SpO2 100% Patient Active Problem List  Diagnoses  . HYPERLIPIDEMIA  . ESSENTIAL HYPERTENSION, BENIGN  . EXTERNAL HEMORRHOIDS  . OBESITY, CLASS II      Review of Systems  Constitutional: Positive for unexpected weight change. Negative for fatigue.  Eyes: Negative for visual disturbance.  Respiratory: Negative for shortness of breath.   Cardiovascular: Negative for chest pain, palpitations and leg swelling.  Gastrointestinal: Positive for blood in stool. Negative for nausea, abdominal pain, diarrhea, constipation and anal bleeding.  Genitourinary: Negative for difficulty urinating.  Musculoskeletal: Negative for myalgias and arthralgias.  Psychiatric/Behavioral: Negative for sleep disturbance and dysphoric mood. The patient is not nervous/anxious.        Objective:   Physical Exam  Constitutional: He appears well-developed and well-nourished. No distress.       obese  HENT:  Head: Normocephalic and atraumatic.  Left Ear: External ear normal.  Nose: Nose normal.  Mouth/Throat: Oropharynx is clear and moist.       Purulent drainage at the TM, R side  Eyes: Pupils are equal, round, and reactive to light.  Neck: Neck supple.  Cardiovascular: Normal rate, regular rhythm and normal heart sounds.   Pulmonary/Chest: Effort normal and breath sounds normal. No respiratory distress.  Abdominal: Soft.  Bowel sounds are normal. He exhibits no distension. There is no tenderness. There is no guarding.       No HSM or AA bruits  Genitourinary: Rectum normal and prostate normal. Guaiac negative stool.  Musculoskeletal: He exhibits no edema.  Skin: Skin is warm.  Psychiatric: He has a normal mood and affect.          Assessment & Plan:  Assesment:  1. CPE- Keeping healthy checklist for men reviewed today.  BP high even on Norvasc 10 mg/ day.  Has had problems with ACEi and HCTZ.  Will try Micardis 40 mg/ day samples and return for nurse BP check in 2-3 wks.    BMI 39  in the class II obesity range.     Labs UTD but will need with a PSA in 3-4 mos.  DRE done today, normal. Colonoscopy due at 51. Immunizations UTD. Encouraged healthy diet, regular exercise, MVI daily. Return for next physical in 1 yr.  Update an ETT for risk of heart dz.    2.  Purulent effusion with intact TM, R treat with cipro otic RX drops.

## 2011-03-05 ENCOUNTER — Telehealth: Payer: Self-pay | Admitting: Family Medicine

## 2011-03-05 MED ORDER — CIPROFLOXACIN HCL 500 MG PO TABS: 500.0000 mg | ORAL_TABLET | Freq: Two times a day (BID) | ORAL | Status: AC

## 2011-03-05 NOTE — Telephone Encounter (Signed)
I got the message about the RX drops being too expensive.  Will change him to 5 days of oral cipro which is cheaper.  Let me know if not improved after 5 days.

## 2011-04-01 ENCOUNTER — Ambulatory Visit (INDEPENDENT_AMBULATORY_CARE_PROVIDER_SITE_OTHER): Payer: BC Managed Care – PPO | Admitting: Family Medicine

## 2011-04-01 ENCOUNTER — Encounter: Payer: Self-pay | Admitting: Family Medicine

## 2011-04-01 DIAGNOSIS — I10 Essential (primary) hypertension: Secondary | ICD-10-CM

## 2011-04-01 DIAGNOSIS — E669 Obesity, unspecified: Secondary | ICD-10-CM

## 2011-04-01 DIAGNOSIS — E785 Hyperlipidemia, unspecified: Secondary | ICD-10-CM

## 2011-04-01 LAB — HEPATIC FUNCTION PANEL
ALT: 22 U/L (ref 0–53)
AST: 22 U/L (ref 0–37)
Albumin: 4.9 g/dL (ref 3.5–5.2)
Alkaline Phosphatase: 57 U/L (ref 39–117)
Bilirubin, Direct: 0.1 mg/dL (ref 0.0–0.3)
Indirect Bilirubin: 0.5 mg/dL (ref 0.0–0.9)
Total Bilirubin: 0.6 mg/dL (ref 0.3–1.2)
Total Protein: 7.2 g/dL (ref 6.0–8.3)

## 2011-04-01 LAB — LDL CHOLESTEROL, DIRECT: Direct LDL: 118 mg/dL — ABNORMAL HIGH

## 2011-04-01 MED ORDER — AMLODIPINE BESYLATE 10 MG PO TABS: 10.0000 mg | ORAL_TABLET | Freq: Every day | ORAL | Status: DC

## 2011-04-01 MED ORDER — TELMISARTAN 40 MG PO TABS: 40.0000 mg | ORAL_TABLET | Freq: Every day | ORAL | Status: DC

## 2011-04-01 NOTE — Patient Instructions (Signed)
Labs today.  RFd both BP meds to stay on everyday.  Will schedule stress echo at [facility] cardiology.  Return for f/u in 1 month.  Work on modifying nighttime eating habits.  Avoid eating after 7 pm.

## 2011-04-01 NOTE — Assessment & Plan Note (Signed)
BP still high today but he ran out of micardis 5 days ago.  Will RF Micardis 40 mg/ day and continue Norvasc 10 mg/ day.  Continue to work on diet, exercise, wt loss.  Update a stress echo.  Check BMP for new start ARB.

## 2011-04-01 NOTE — Progress Notes (Signed)
  Subjective:    Patient ID: Donald Olson, male    DOB: [birth year], 45 y.o.   MRN: [id number]  HPI 45 yo AAM presents for f/u HTN.    He did well on Micardis 40 mg samples but ran out 5 days ago.  He has had slight LE edema from the Norvasc 10 mg/ day.  Doing well on Crestor and due to repeat labs.  Had one bout of mid sternal chest pain, sharp in nature that radiated to the back when lifting baby carrier.  He has not had a stress test in years.  Denies chest pain or DOE with exercise.  He is still struggling to lose weight.  Works out by [employer] and walking for a combined 45-60 min 5 days/ wk and sticks to a low carb/ high protein diet but then craves carbs at night and eats a big bowl of cereal before going to bed.  BP 146/85  Pulse 56  Wt 285 lb (129.275 kg)  SpO2 96%   Review of Systems  Constitutional: Negative for fatigue.  Eyes: Negative for visual disturbance.  Respiratory: Negative for shortness of breath.   Cardiovascular: Positive for leg swelling. Negative for chest pain and palpitations.  Genitourinary: Negative for difficulty urinating.  Neurological: Negative for headaches.  Psychiatric/Behavioral: Negative for dysphoric mood.       Objective:   Physical Exam  Constitutional: He appears well-developed and well-nourished. No distress.       obese  HENT:  Mouth/Throat: Oropharynx is clear and moist.  Eyes: Pupils are equal, round, and reactive to light.  Neck: Neck supple. No thyromegaly present.  Cardiovascular: Normal rate, regular rhythm and normal heart sounds.   No murmur heard. Pulmonary/Chest: Effort normal and breath sounds normal. No respiratory distress.  Musculoskeletal: He exhibits no edema.  Psychiatric: He has a normal mood and affect.          Assessment & Plan:

## 2011-04-01 NOTE — Assessment & Plan Note (Signed)
F/u labs today.  Continue crestor.

## 2011-04-02 LAB — BASIC METABOLIC PANEL WITH GFR
Calcium: 9.4 mg/dL (ref 8.4–10.5)
Chloride: 102 mEq/L (ref 96–112)
Creat: 0.84 mg/dL (ref 0.50–1.35)
GFR, Est African American: >60 mL/min (ref >60)
GFR, Est Non African American: >60 mL/min (ref >60)
Glucose, Bld: 83 mg/dL (ref 70–99)
Potassium: 4.4 mEq/L (ref 3.5–5.3)
Sodium: 137 mEq/L (ref 135–145)

## 2011-04-04 ENCOUNTER — Telehealth: Payer: Self-pay | Admitting: Family Medicine

## 2011-04-04 NOTE — Telephone Encounter (Signed)
Pls let pt know that his bad cholesterol has come down from 139 to 118 (much better!) and liver enzymes are normal.    WAITING FOR THE LAB TO RUN HIS CHEMISTRY PANEL, SO THIS IS STILL PENDING.

## 2011-04-04 NOTE — Telephone Encounter (Signed)
LMOM advising pt of results

## 2011-04-05 ENCOUNTER — Telehealth: Payer: Self-pay | Admitting: Family Medicine

## 2011-04-05 NOTE — Telephone Encounter (Signed)
Pls let pt know that his LDL bad chol is down to 118, much improved and now at goal.  Chemistry panel is normal. Stay on current meds.

## 2011-04-05 NOTE — Telephone Encounter (Signed)
Pt aware of the above  

## 2011-04-11 ENCOUNTER — Telehealth: Payer: Self-pay | Admitting: Family Medicine

## 2011-04-11 NOTE — Telephone Encounter (Signed)
Patient called and was wondering if he is suppose to be taking 2 BP meds as you had discussed with him because he only had 1 BP med called in for him.  Thanks

## 2011-04-12 MED ORDER — TELMISARTAN 40 MG PO TABS: 40.0000 mg | ORAL_TABLET | Freq: Every day | ORAL | Status: DC

## 2011-04-12 NOTE — Telephone Encounter (Signed)
Yes, he is supposed to be on norvasc and micardis.  Both sent to pharmacy on 7-23.  Michelle, pls check to see what happened.  thankts.

## 2011-04-12 NOTE — Telephone Encounter (Signed)
Addended by: Ellsworth Lennox on: 04/12/2011 10:36 AM   Modules accepted: Orders

## 2011-04-15 ENCOUNTER — Telehealth: Payer: Self-pay | Admitting: Family Medicine

## 2011-04-15 MED ORDER — TELMISARTAN 40 MG PO TABS: 40.0000 mg | ORAL_TABLET | Freq: Every day | ORAL | Status: DC

## 2011-04-15 NOTE — Telephone Encounter (Signed)
Called pt back and the pharmacy had not received Micardis so informed pt that I resent med to [facility] for him. KJ LPN

## 2011-04-15 NOTE — Telephone Encounter (Signed)
Patient called advised he went to [facility] to pick up his prescription and it was not sent to the pharmacy. Pt states he has come to pick it up twice so far and they are telling him they do not have it. Pt states the prescription starts with an M but I think its Micardis.Marland KitchenMarland KitchenMichelle's notes states she sent norvasc and micardis on 04/01/11.Marland KitchenPls cll pt back request a call back asap

## 2011-04-17 ENCOUNTER — Telehealth: Payer: Self-pay | Admitting: Family Medicine

## 2011-04-17 NOTE — Telephone Encounter (Signed)
Pt called and had problems with his prescription for BP. Plan:  Reviewed the chart and when looking BP med was already sent electronically.  Pt wanted to also sched appt to come in to go over echo report before Dr. Cathey Endow leaves.  Sched for 04-29-11 with Dr. Cathey Endow.  Called and got report from [facility] card. Jarvis Newcomer, LPN [facility]

## 2011-04-17 NOTE — Telephone Encounter (Signed)
Patients wife called about prescription Micardis stating [facility] still doesn't have script..Wife's name is Shanda Bumps [phone] (wrk)

## 2011-04-17 NOTE — Telephone Encounter (Signed)
Pt wife notified that I called med into pharmacy as they never received it electronically. KJ LPN

## 2011-04-18 ENCOUNTER — Telehealth: Payer: Self-pay | Admitting: Family Medicine

## 2011-04-18 NOTE — Telephone Encounter (Signed)
Pt aware of the above  

## 2011-04-18 NOTE — Telephone Encounter (Signed)
Pls let pt know that his stress test came back normal other than a mildly thickened Left ventricular wall.  Treatment for this includes control of BP and weight management.  OK to proceed with vigorous exercise.

## 2011-04-24 ENCOUNTER — Encounter: Payer: Self-pay | Admitting: Family Medicine

## 2011-04-29 ENCOUNTER — Ambulatory Visit (INDEPENDENT_AMBULATORY_CARE_PROVIDER_SITE_OTHER): Payer: BC Managed Care – PPO | Admitting: Family Medicine

## 2011-04-29 ENCOUNTER — Encounter: Payer: Self-pay | Admitting: Family Medicine

## 2011-04-29 VITALS — BP 134/81 | HR 60 | Wt 279.0 lb

## 2011-04-29 DIAGNOSIS — I10 Essential (primary) hypertension: Secondary | ICD-10-CM

## 2011-04-29 NOTE — Patient Instructions (Signed)
Keep up the good work with healthy diet and vigorous exercise for 60  Minutes 5 days/ wk.

## 2011-04-29 NOTE — Assessment & Plan Note (Signed)
BP is at goal on current meds and he is tolerating this combination well.  We reviewed the results of his stress echo and it is not surprising that he had mild LVH on his study.  Discussed with pt why this happens with HTN and obesity and to work on wt loss, medication compliance.  Suggest repeating study in 2 yr.

## 2011-04-29 NOTE — Progress Notes (Signed)
  Subjective:    Patient ID: Donald Olson, male    DOB: 12-09-1965, 45 y.o.   MRN: [id number]  HPI  45 yo AAM presents to discuss his stress echo results.  He has HTN, dyslipidemia and is obese.  He is working on his diet and exercise but has really failed to lose any weight.  He is doing well on his current meds.  His stress echo was normal other than some mild LVH.  He is able to vigorously exercise without CP or DOE.  BP 134/81  Pulse 60  Wt 279 lb (126.554 kg)  SpO2 96%   Review of Systems  Constitutional: Negative for fatigue.  Respiratory: Negative for shortness of breath.   Cardiovascular: Negative for chest pain, palpitations and leg swelling.  Genitourinary: Negative for difficulty urinating.  Neurological: Negative for headaches.       Objective:   Physical Exam  Constitutional: He appears well-developed and well-nourished.       obese  HENT:  Mouth/Throat: Oropharynx is clear and moist.  Neck: No thyromegaly present.  Cardiovascular: Normal rate, regular rhythm and normal heart sounds.   Pulmonary/Chest: Effort normal and breath sounds normal.  Musculoskeletal: He exhibits no edema.  Skin: Skin is warm and dry.  Psychiatric: He has a normal mood and affect.          Assessment & Plan:

## 2011-05-01 ENCOUNTER — Encounter: Payer: Self-pay | Admitting: Family Medicine

## 2011-05-06 ENCOUNTER — Ambulatory Visit: Payer: BC Managed Care – PPO | Admitting: Family Medicine

## 2011-05-24 ENCOUNTER — Other Ambulatory Visit: Payer: Self-pay | Admitting: Family Medicine

## 2011-05-24 NOTE — Telephone Encounter (Signed)
[facility] [place] requested refill for amlodipine 10 mg, but refills # 30/3 refills was given on 04-01-11.  Pt should be good till 08-02-11.  Pharmacy notified. Jarvis Newcomer, LPN [facility]

## 2011-06-03 ENCOUNTER — Ambulatory Visit: Payer: BC Managed Care – PPO | Admitting: Family Medicine

## 2011-06-27 ENCOUNTER — Other Ambulatory Visit: Payer: Self-pay | Admitting: *Deleted

## 2011-06-27 MED ORDER — ROSUVASTATIN CALCIUM 10 MG PO TABS: 10.0000 mg | ORAL_TABLET | Freq: Every day | ORAL | Status: DC

## 2011-06-27 MED ORDER — TELMISARTAN 40 MG PO TABS: 40.0000 mg | ORAL_TABLET | Freq: Every day | ORAL | Status: DC

## 2011-06-27 MED ORDER — AMLODIPINE BESYLATE 10 MG PO TABS: 10.0000 mg | ORAL_TABLET | Freq: Every day | ORAL | Status: DC

## 2011-07-15 ENCOUNTER — Encounter: Payer: Self-pay | Admitting: Family Medicine

## 2011-07-15 ENCOUNTER — Ambulatory Visit (INDEPENDENT_AMBULATORY_CARE_PROVIDER_SITE_OTHER): Payer: BC Managed Care – PPO | Admitting: Family Medicine

## 2011-07-15 VITALS — BP 147/86 | HR 63 | Wt 285.0 lb

## 2011-07-15 DIAGNOSIS — Z23 Encounter for immunization: Secondary | ICD-10-CM

## 2011-07-15 DIAGNOSIS — I10 Essential (primary) hypertension: Secondary | ICD-10-CM

## 2011-07-15 MED ORDER — TELMISARTAN 40 MG PO TABS: 40.0000 mg | ORAL_TABLET | Freq: Every day | ORAL | Status: DC

## 2011-07-15 MED ORDER — AMLODIPINE BESYLATE 10 MG PO TABS: 10.0000 mg | ORAL_TABLET | Freq: Every day | ORAL | Status: DC

## 2011-07-15 NOTE — Patient Instructions (Signed)
Make sure eating a low salt diet Nurse visit in 1-2 weeks to recheck med.  You were given a Tdap today.

## 2011-07-15 NOTE — Progress Notes (Signed)
  Subjective:    Patient ID: Donald Olson, male    DOB: 05-Jun-1966, 45 y.o.   MRN: [id number]  Hypertension This is a chronic problem. The current episode started more than 1 year ago. The problem is controlled. Pertinent negatives include no chest pain or shortness of breath. Agents associated with hypertension include NSAIDs. Past treatments include angiotensin blockers and calcium channel blockers. The current treatment provides moderate improvement. Compliance problems include exercise.    Out of the micardis for about 2 weeks.   Review of Systems  Respiratory: Negative for shortness of breath.   Cardiovascular: Negative for chest pain.   BP 147/86  Pulse 63  Wt 285 lb (129.275 kg)    No Known Allergies  Past Medical History  Diagnosis Date  . Hypertension   . Obesity   . Hyperlipidemia     Past Surgical History  Procedure Date  . Appendectomy 21  . Patellar tendon surgery     bilateral    History   Social History  . Marital Status: Married    Spouse Name: N/A    Number of Children: N/A  . Years of Education: N/A   Occupational History  . Not on file.   Social History Main Topics  . Smoking status: Never Smoker   . Smokeless tobacco: Not on file  . Alcohol Use: No  . Drug Use:   . Sexually Active:      owns barber shop, and does leaf removal, married,daughter, wants to exercise.   Other Topics Concern  . Not on file   Social History Narrative  . No narrative on file    Family History  Problem Relation Age of Onset  . Cancer Father 55    pancreatic  . Hypertension Brother   . Diabetes      uncle/ERSD  . Diabetes      cousin    Mr. Takagi had no medications administered during this visit.     Objective:   Physical Exam  Constitutional: He is oriented to person, place, and time. He appears well-developed.  HENT:  Head: Normocephalic and atraumatic.  Cardiovascular: Normal rate, regular rhythm and normal heart sounds.        No carotid  bruits.   Pulmonary/Chest: Effort normal and breath sounds normal.  Neurological: He is alert and oriented to person, place, and time.  Skin: Skin is warm and dry.  Psychiatric: He has a normal mood and affect. His behavior is normal.          Assessment & Plan:  HTN - Up today. He is off his micardis. Refilled meds for micardis.  F/U in 2 weeks for nurse BP check. Reviewed reg exercise, healthy low salt diet.  Labs are up to date.   Given Tdap today.

## 2011-10-18 ENCOUNTER — Other Ambulatory Visit: Payer: Self-pay | Admitting: *Deleted

## 2011-10-18 MED ORDER — AMLODIPINE BESYLATE 10 MG PO TABS: 10.0000 mg | ORAL_TABLET | Freq: Every day | ORAL | Status: DC

## 2011-10-30 ENCOUNTER — Other Ambulatory Visit: Payer: Self-pay | Admitting: *Deleted

## 2011-10-30 MED ORDER — TELMISARTAN 40 MG PO TABS: 40.0000 mg | ORAL_TABLET | Freq: Every day | ORAL | Status: DC

## 2011-10-31 ENCOUNTER — Telehealth: Payer: Self-pay | Admitting: *Deleted

## 2011-10-31 NOTE — Telephone Encounter (Signed)
error 

## 2011-11-01 ENCOUNTER — Other Ambulatory Visit: Payer: Self-pay | Admitting: *Deleted

## 2011-11-01 MED ORDER — TELMISARTAN 40 MG PO TABS: 40.0000 mg | ORAL_TABLET | Freq: Every day | ORAL | Status: DC

## 2011-12-30 ENCOUNTER — Other Ambulatory Visit: Payer: Self-pay | Admitting: Family Medicine

## 2012-02-02 ENCOUNTER — Other Ambulatory Visit: Payer: Self-pay | Admitting: Family Medicine

## 2012-04-06 ENCOUNTER — Encounter: Payer: Self-pay | Admitting: Family Medicine

## 2012-04-06 ENCOUNTER — Ambulatory Visit (INDEPENDENT_AMBULATORY_CARE_PROVIDER_SITE_OTHER): Payer: BC Managed Care – PPO | Admitting: Family Medicine

## 2012-04-06 VITALS — BP 155/87 | HR 69 | Ht 71.0 in | Wt 295.0 lb

## 2012-04-06 DIAGNOSIS — E785 Hyperlipidemia, unspecified: Secondary | ICD-10-CM

## 2012-04-06 DIAGNOSIS — I10 Essential (primary) hypertension: Secondary | ICD-10-CM

## 2012-04-06 MED ORDER — ROSUVASTATIN CALCIUM 10 MG PO TABS: 10.0000 mg | ORAL_TABLET | Freq: Every day | ORAL | Status: DC

## 2012-04-06 MED ORDER — LOSARTAN POTASSIUM 50 MG PO TABS: 50.0000 mg | ORAL_TABLET | Freq: Every day | ORAL | Status: DC

## 2012-04-06 MED ORDER — AMLODIPINE BESYLATE 10 MG PO TABS: 10.0000 mg | ORAL_TABLET | Freq: Every day | ORAL | Status: DC

## 2012-04-06 NOTE — Progress Notes (Signed)
  Subjective:    Patient ID: Donald Olson, male    DOB: [birth year], 46 y.o.   MRN: [id number]  HPI HTN - Never took micardis bc of cost.  Ran out of amlodipine 3 weeks ago.  Not really following a low salt diet.  Have been working out regularly. No CP ro SOB.    Hyperlipidemia - No SE of the medicatoin.  Taking every night.    Review of Systems     Objective:   Physical Exam  Constitutional: He is oriented to person, place, and time. He appears well-developed and well-nourished.  HENT:  Head: Normocephalic and atraumatic.  Cardiovascular: Normal rate, regular rhythm and normal heart sounds.   Pulmonary/Chest: Effort normal and breath sounds normal.  Neurological: He is alert and oriented to person, place, and time.  Skin: Skin is warm and dry.  Psychiatric: He has a normal mood and affect. His behavior is normal.          Assessment & Plan:  HTN - Need CMP and lipids. Given labslip.  Will change to losartan bc of cost.  Followup in one month for blood pressure check with either me or for nurse visit.  Only sent prescriptions for 6 months worse.  Hyperlipidemia-due to recheck cholesterol. He has been taking his Crestor regularly. We gave him a coupon card today.

## 2012-04-10 LAB — COMPLETE METABOLIC PANEL WITH GFR
ALT: 27 U/L (ref 0–53)
AST: 37 U/L (ref 0–37)
Albumin: 4.7 g/dL (ref 3.5–5.2)
Alkaline Phosphatase: 60 U/L (ref 39–117)
BUN: 13 mg/dL (ref 6–23)
CO2: 26 mEq/L (ref 19–32)
Calcium: 9.1 mg/dL (ref 8.4–10.5)
Chloride: 103 mEq/L (ref 96–112)
Creat: 0.74 mg/dL (ref 0.50–1.35)
GFR, Est African American: >89 mL/min
GFR, Est Non African American: >89 mL/min
Glucose, Bld: 115 mg/dL — ABNORMAL HIGH (ref 70–99)
Potassium: 4.3 mEq/L (ref 3.5–5.3)
Sodium: 138 mEq/L (ref 135–145)
Total Bilirubin: 1 mg/dL (ref 0.3–1.2)
Total Protein: 7 g/dL (ref 6.0–8.3)

## 2012-04-10 LAB — LIPID PANEL
Cholesterol: 209 mg/dL — ABNORMAL HIGH (ref 0–200)
HDL: 34 mg/dL — ABNORMAL LOW (ref >39)
LDL Cholesterol: 151 mg/dL — ABNORMAL HIGH (ref 0–99)
Total CHOL/HDL Ratio: 6.1 Ratio
Triglycerides: 121 mg/dL (ref <150)
VLDL: 24 mg/dL (ref 0–40)

## 2012-04-13 ENCOUNTER — Other Ambulatory Visit: Payer: Self-pay | Admitting: Family Medicine

## 2012-04-13 MED ORDER — ROSUVASTATIN CALCIUM 20 MG PO TABS: 20.0000 mg | ORAL_TABLET | Freq: Every day | ORAL | Status: DC

## 2012-05-04 ENCOUNTER — Ambulatory Visit: Payer: BC Managed Care – PPO | Admitting: Family Medicine

## 2012-05-12 ENCOUNTER — Ambulatory Visit (INDEPENDENT_AMBULATORY_CARE_PROVIDER_SITE_OTHER): Payer: BC Managed Care – PPO | Admitting: Family Medicine

## 2012-05-12 ENCOUNTER — Encounter: Payer: Self-pay | Admitting: Family Medicine

## 2012-05-12 VITALS — BP 125/75 | HR 97 | Temp 97.0°F | Resp 16 | Wt 297.0 lb

## 2012-05-12 DIAGNOSIS — R7301 Impaired fasting glucose: Secondary | ICD-10-CM

## 2012-05-12 DIAGNOSIS — E785 Hyperlipidemia, unspecified: Secondary | ICD-10-CM

## 2012-05-12 DIAGNOSIS — I10 Essential (primary) hypertension: Secondary | ICD-10-CM

## 2012-05-12 NOTE — Progress Notes (Addendum)
CC: Donald Olson is a 46 y.o. male is here for Hypertension   Subjective: HPI:  Presents for hypertension followup. His last visit he was started on losartan. Not taken his blood pressure outside of our office. Denies motor or sensory disturbances, chest pain, shortness of breath, orthopnea, irregular heartbeat, nor peripheral edema. Complains of almost daily headaches located in the for head and in the anterior temples, nonpulsatile, not related to position or exertion. Goes away with Aleve. Takes amlodipine and Crestor at night, losartan in the morning. Denies fevers, dizziness, ear discomfort. Complains of nasal irritation mild congestion.  Lab work in early August showed elevated glucose in the prediabetes range. Patient has gained some weight since last visit. No polyuria polyphasia or polydipsia    Review Of Systems Outlined In HPI  Past Medical History  Diagnosis Date  . Hypertension   . Obesity   . Hyperlipidemia      Family History  Problem Relation Age of Onset  . Cancer Father 93    pancreatic  . Hypertension Brother   . Diabetes      uncle/ERSD  . Diabetes      cousin     History  Substance Use Topics  . Smoking status: Never Smoker   . Smokeless tobacco: Not on file  . Alcohol Use: No     Objective: Filed Vitals:   05/12/12 0822  BP: 125/75  Pulse: 97  Temp: 97 F (36.1 C)  Resp: 16    General: Alert and Oriented, No Acute Distress HEENT: Pupils equal, round, reactive to light. Conjunctivae clear.  External ears unremarkable, canals clear with intact TMs with appropriate landmarks.  Middle ear appears open without effusion. Pink inferior turbinates.  Moist mucous membranes, pharynx without inflammation nor lesions.  Neck supple without palpable lymphadenopathy nor abnormal masses. Lungs: Clear to auscultation bilaterally, no wheezing/ronchi/rales.  Comfortable work of breathing. Good air movement. Cardiac: Regular rate and rhythm. Normal S1/S2.  No  murmurs, rubs, nor gallops.  No carotid bruits Extremities: No peripheral edema.  Strong peripheral pulses.   Assessment & Plan: Devynn was seen today for hypertension.  Diagnoses and associated orders for this visit:  Essential hypertension, benign  Hyperlipidemia - Lipid panel  Elevated fasting glucose - Hemoglobin A1c    Offered him a fingerstick A1c today however he had reservations about having this procedure do to a little anxiety that might uncover sugar issues. He preferred to focus more on weight loss between now and his four-month followup visit before having an A1c or sugar check. Lab slip given for fasting labs 4 weeks on today's prior to followup visit with myself or his PCP.  Take both blood pressure medications at night, if headaches persist trial of Nasonex.Signs and symptoms requring emergent/urgent reevaluation were discussed with the patient.  Return in about 4 weeks (around 06/09/2012).  Requested Prescriptions    No prescriptions requested or ordered in this encounter

## 2012-05-12 NOTE — Patient Instructions (Addendum)
Return in 1 month, have blood work done before the visit.

## 2012-06-15 ENCOUNTER — Encounter: Payer: Self-pay | Admitting: Family Medicine

## 2012-06-15 ENCOUNTER — Ambulatory Visit (INDEPENDENT_AMBULATORY_CARE_PROVIDER_SITE_OTHER): Payer: BC Managed Care – PPO | Admitting: Family Medicine

## 2012-06-15 VITALS — BP 147/94 | HR 56 | Wt 298.0 lb

## 2012-06-15 DIAGNOSIS — E785 Hyperlipidemia, unspecified: Secondary | ICD-10-CM

## 2012-06-15 DIAGNOSIS — E669 Obesity, unspecified: Secondary | ICD-10-CM

## 2012-06-15 DIAGNOSIS — I10 Essential (primary) hypertension: Secondary | ICD-10-CM

## 2012-06-15 LAB — LIPID PANEL
Cholesterol: 212 mg/dL — ABNORMAL HIGH (ref 0–200)
HDL: 36 mg/dL — ABNORMAL LOW (ref >39)
LDL Cholesterol: 149 mg/dL — ABNORMAL HIGH (ref 0–99)
Total CHOL/HDL Ratio: 5.9 Ratio
Triglycerides: 136 mg/dL (ref <150)
VLDL: 27 mg/dL (ref 0–40)

## 2012-06-15 LAB — COMPLETE METABOLIC PANEL WITH GFR
ALT: 27 U/L (ref 0–53)
AST: 21 U/L (ref 0–37)
Albumin: 4.7 g/dL (ref 3.5–5.2)
Alkaline Phosphatase: 60 U/L (ref 39–117)
BUN: 7 mg/dL (ref 6–23)
CO2: 29 mEq/L (ref 19–32)
Calcium: 9.4 mg/dL (ref 8.4–10.5)
Chloride: 102 mEq/L (ref 96–112)
Creat: 0.87 mg/dL (ref 0.50–1.35)
GFR, Est African American: >89 mL/min
GFR, Est Non African American: >89 mL/min
Glucose, Bld: 100 mg/dL — ABNORMAL HIGH (ref 70–99)
Potassium: 4.4 mEq/L (ref 3.5–5.3)
Sodium: 139 mEq/L (ref 135–145)
Total Bilirubin: 0.7 mg/dL (ref 0.3–1.2)
Total Protein: 7 g/dL (ref 6.0–8.3)

## 2012-06-15 LAB — HEMOGLOBIN A1C
Hgb A1c MFr Bld: 6.4 % — ABNORMAL HIGH (ref <5.7)
Mean Plasma Glucose: 137 mg/dL — ABNORMAL HIGH (ref <117)

## 2012-06-15 NOTE — Progress Notes (Signed)
  Subjective:    Patient ID: Donald Olson, male    DOB: 09-Dec-1965, 46 y.o.   MRN: [id number]  HPI HTN - no CP or SOB. Taking meds but ran out 2 days ago and hasn't refilled them yet.    Hyperlipidemia - tolerating crestor well. No Myalgias or SE.    Review of Systems     Objective:   Physical Exam  Constitutional: He is oriented to person, place, and time. He appears well-developed and well-nourished.  HENT:  Head: Normocephalic and atraumatic.  Cardiovascular: Normal rate, regular rhythm and normal heart sounds.   Pulmonary/Chest: Effort normal and breath sounds normal.  Neurological: He is alert and oriented to person, place, and time.  Skin: Skin is warm and dry.  Psychiatric: He has a normal mood and affect. His behavior is normal.          Assessment & Plan:  HTN- BP was great 1 month ago on meds. Will continue current regimen. F/u in 6 months if doing well  Hyperlipidema- Recheck levels today. F/U in 6months if well controlled. We will call with results. Continue work on diet and exercise. Also stressed the importance of weight loss.  Obesity-we discussed the importance of weight loss. I would love  for him to get close to 200 pounds.  Declined flu vaccine.

## 2012-06-16 NOTE — Progress Notes (Signed)
Quick Note:  All labs are normal. ______ 

## 2012-08-15 ENCOUNTER — Other Ambulatory Visit: Payer: Self-pay | Admitting: Family Medicine

## 2012-09-21 ENCOUNTER — Ambulatory Visit: Payer: BC Managed Care – PPO | Admitting: Family Medicine

## 2012-09-25 ENCOUNTER — Other Ambulatory Visit: Payer: Self-pay | Admitting: *Deleted

## 2012-09-25 MED ORDER — ROSUVASTATIN CALCIUM 20 MG PO TABS: 20.0000 mg | ORAL_TABLET | Freq: Every day | ORAL | Status: DC

## 2012-10-05 ENCOUNTER — Ambulatory Visit (INDEPENDENT_AMBULATORY_CARE_PROVIDER_SITE_OTHER): Payer: BC Managed Care – PPO | Admitting: Family Medicine

## 2012-10-05 ENCOUNTER — Encounter: Payer: Self-pay | Admitting: Family Medicine

## 2012-10-05 VITALS — BP 135/79 | HR 57 | Ht 71.0 in | Wt 299.4 lb

## 2012-10-05 DIAGNOSIS — R7301 Impaired fasting glucose: Secondary | ICD-10-CM

## 2012-10-05 DIAGNOSIS — M25559 Pain in unspecified hip: Secondary | ICD-10-CM

## 2012-10-05 DIAGNOSIS — R739 Hyperglycemia, unspecified: Secondary | ICD-10-CM

## 2012-10-05 DIAGNOSIS — R7309 Other abnormal glucose: Secondary | ICD-10-CM

## 2012-10-05 DIAGNOSIS — I10 Essential (primary) hypertension: Secondary | ICD-10-CM

## 2012-10-05 DIAGNOSIS — M25552 Pain in left hip: Secondary | ICD-10-CM

## 2012-10-05 LAB — POCT GLYCOSYLATED HEMOGLOBIN (HGB A1C): Hemoglobin A1C: 6.4

## 2012-10-05 MED ORDER — ATORVASTATIN CALCIUM 80 MG PO TABS: 80.0000 mg | ORAL_TABLET | Freq: Every day | ORAL | Status: DC

## 2012-10-05 NOTE — Progress Notes (Signed)
  Subjective:    Patient ID: Donald Olson, male    DOB: 03-27-1966, 47 y.o.   MRN: [id number]  HPI HTN -  Pt denies chest pain, SOB, dizziness, or heart palpitations.  Taking meds as directed w/o problems.  Denies medication side effects.   IFG - Her to f/u on abnormal A1C.  No polyuria or polydypsia.  No regular exercise right now. Says hip keeps from working out.  He does drink a lot of sweet tea.    Left hip pain. Wants to discuss further.   Review of Systems     Objective:   Physical Exam  Constitutional: He is oriented to person, place, and time. He appears well-developed and well-nourished.  HENT:  Head: Normocephalic and atraumatic.  Cardiovascular: Normal rate, regular rhythm and normal heart sounds.   Pulmonary/Chest: Effort normal and breath sounds normal.  Neurological: He is alert and oriented to person, place, and time.  Skin: Skin is warm and dry.  Psychiatric: He has a normal mood and affect. His behavior is normal.          Assessment & Plan:  HTN - Well controlled.  Continue current regimen.    IFG - discussed tx options including deit, exercise, and medication options. He does drink a lot of sweet tea.  A1c is 6.1 today which is improved. Stress really working on cutting back on concentrated sweets such as a sweet tea. Followup in 3 months for repeat hemoglobin A1c.  Left hip pain- he had an injury over 10 years ago in the [employer]. He pulled a muscle in his left hip. He's had problems with it ever since. He says sometimes when he really gets into working out fairly regularly it starts to bother him. He hasn't seen anybody for it at this the initial injury 10 years ago. I have told him that surgery was an option but that it might not really help so he didn't have it done. I recommend he see my partner Dr. Maisie Fus I can him for further evaluation. Him that maybe there some rehabilitation her things to work on to improve his pain level so that he can work out as he  would like to.

## 2012-10-06 ENCOUNTER — Ambulatory Visit (INDEPENDENT_AMBULATORY_CARE_PROVIDER_SITE_OTHER): Payer: BC Managed Care – PPO | Admitting: Sports Medicine

## 2012-10-06 ENCOUNTER — Ambulatory Visit (INDEPENDENT_AMBULATORY_CARE_PROVIDER_SITE_OTHER): Payer: BC Managed Care – PPO

## 2012-10-06 DIAGNOSIS — M25552 Pain in left hip: Secondary | ICD-10-CM

## 2012-10-06 DIAGNOSIS — M25559 Pain in unspecified hip: Secondary | ICD-10-CM

## 2012-10-06 MED ORDER — MELOXICAM 15 MG PO TABS: ORAL_TABLET | ORAL | Status: DC

## 2012-10-06 NOTE — Assessment & Plan Note (Signed)
Pain with internal rotation as well as hip flexion points to the psoas major as well as hip joint is possible pain generators. Mobic, formal physical therapy for hip flexors rehabilitation, weightbearing x-rays. See him back in 3-4 weeks and if no better we can consider an intra-articular injection for diagnostic and therapeutic purposes.

## 2012-10-06 NOTE — Progress Notes (Signed)
SPORTS MEDICINE CONSULTATION REPORT  Subjective:    I'm seeing this patient as a consultation for:  Dr. Linford Arnold  CC: Left hip pain  HPI: This is a very pleasant 47 year old male who comes in with a long history of pain that he localizes in his left groin. Approximately 20 years ago he tore a muscle in his anterior thigh, which seems to be the rectus femoris, but is unaware of the exact muscle. Since then he's had pain that he localizes in the groin, without radiation. Worse with weightbearing, better with rest. He tried some Aleve, and ibuprofen with good benefit. He's never had injection therapy, imaging, or physical therapy. Pain is worse also with hip flexion. He denies any back pain, and denies any numbness, tingling, or radiation of pain down his leg. It is not worse with Valsalva.  Past medical history, Surgical history, Family history not pertinant except as noted below, Social history, Allergies, and medications have been entered into the medical record, reviewed, and no changes needed.   Review of Systems: No headache, visual changes, nausea, vomiting, diarrhea, constipation, dizziness, abdominal pain, skin rash, fevers, chills, night sweats, weight loss, swollen lymph nodes, body aches, joint swelling, muscle aches, chest pain, shortness of breath, mood changes, visual or auditory hallucinations.   Objective:   General: Well Developed, well nourished, and in no acute distress.  Neuro/Psych: Alert and oriented x3, extra-ocular muscles intact, able to move all 4 extremities, sensation grossly intact. Skin: Warm and dry, no rashes noted.  Respiratory: Not using accessory muscles, speaking in full sentences, trachea midline.  Cardiovascular: Pulses palpable, no extremity edema. Abdomen: Does not appear distended. Left Hip: ROM IR: 30 Deg, ER: 45 Deg, Flexion: 120 Deg, Extension: 100 Deg, Abduction: 45 Deg, Adduction: 45 Deg, there is reproduction of his pain with internal rotation of  the left hip. Strength IR: 5/5, ER: 5/5, Flexion: 5/5, Extension: 5/5, Abduction: 5/5, Adduction: 5/5, he does have 5 out of 5 strength, with some reproduction of pain to flexion. I can palpate the retracted end of his rectus femoris muscle over the anterior thigh. Pelvic alignment unremarkable to inspection and palpation. Standing hip rotation and gait without trendelenburg sign / unsteadiness. Greater trochanter without tenderness to palpation. No tenderness over piriformis and greater trochanter. No pain with FABER or FADIR. No SI joint tenderness and normal minimal SI movement.  I reviewed xrays, they show fairly well maintained joint spaces.  Impression and Recommendations:   This case required medical decision making of moderate complexity.

## 2012-10-12 ENCOUNTER — Ambulatory Visit: Payer: BC Managed Care – PPO | Admitting: Physical Therapy

## 2012-10-19 ENCOUNTER — Ambulatory Visit: Payer: BC Managed Care – PPO | Admitting: Physical Therapy

## 2012-10-19 ENCOUNTER — Ambulatory Visit: Payer: BC Managed Care – PPO | Attending: Sports Medicine | Admitting: Physical Therapy

## 2012-10-19 DIAGNOSIS — IMO0001 Reserved for inherently not codable concepts without codable children: Secondary | ICD-10-CM | POA: Insufficient documentation

## 2012-10-19 DIAGNOSIS — M25559 Pain in unspecified hip: Secondary | ICD-10-CM | POA: Insufficient documentation

## 2012-10-26 ENCOUNTER — Ambulatory Visit: Payer: BC Managed Care – PPO | Admitting: Physical Therapy

## 2012-10-26 ENCOUNTER — Ambulatory Visit: Payer: BC Managed Care – PPO | Admitting: Sports Medicine

## 2012-10-28 ENCOUNTER — Ambulatory Visit: Payer: BC Managed Care – PPO | Admitting: Physical Therapy

## 2012-11-02 ENCOUNTER — Other Ambulatory Visit: Payer: Self-pay | Admitting: Family Medicine

## 2012-11-02 ENCOUNTER — Ambulatory Visit: Payer: BC Managed Care – PPO | Admitting: Physical Therapy

## 2012-11-04 ENCOUNTER — Ambulatory Visit: Payer: BC Managed Care – PPO | Admitting: Physical Therapy

## 2012-11-09 ENCOUNTER — Ambulatory Visit: Payer: BC Managed Care – PPO | Attending: Sports Medicine | Admitting: Physical Therapy

## 2012-11-09 DIAGNOSIS — M25559 Pain in unspecified hip: Secondary | ICD-10-CM | POA: Insufficient documentation

## 2012-11-09 DIAGNOSIS — IMO0001 Reserved for inherently not codable concepts without codable children: Secondary | ICD-10-CM | POA: Insufficient documentation

## 2012-11-11 ENCOUNTER — Ambulatory Visit: Payer: BC Managed Care – PPO | Admitting: Physical Therapy

## 2012-11-16 ENCOUNTER — Ambulatory Visit (INDEPENDENT_AMBULATORY_CARE_PROVIDER_SITE_OTHER): Payer: BC Managed Care – PPO | Admitting: Sports Medicine

## 2012-11-16 DIAGNOSIS — M25552 Pain in left hip: Secondary | ICD-10-CM

## 2012-11-16 DIAGNOSIS — M25559 Pain in unspecified hip: Secondary | ICD-10-CM

## 2012-11-16 NOTE — Assessment & Plan Note (Addendum)
Pain is localized in the left groin. Symptoms are suggestive of both intra-articular as well as hip flexor pathology. As he did only have 50% resolution after formal physical therapy I performed an intra-articular diagnostic/therapeutic femoroacetabular joint injection today. Should pain resolve completely, this confirms intra-articular source of pain. Should it not, we would need to work further on hip flexors. I would like to see him back in 4 weeks to see how things are going.

## 2012-11-16 NOTE — Progress Notes (Signed)
  Subjective:    CC: Followup  HPI: Left hip pain: This pleasant 47 year old male comes back for followup of left groin pain that he's had for years. On exam at the last visit symptoms are predominantly intra-articular with pain on internal rotation as well as hip flexor with pain with resisted hip flexion. I placed him on oral anti-inflammatories as well as 4 weeks for formal physical therapy. He notes that he has plateaued and his pain is only 50% better. He continues to be both with internal rotation, weightbearing, and flexion of the left hip.  Past medical history, Surgical history, Family history not pertinant except as noted below, Social history, Allergies, and medications have been entered into the medical record, reviewed, and no changes needed.   Review of Systems: No headache, visual changes, nausea, vomiting, diarrhea, constipation, dizziness, abdominal pain, skin rash, fevers, chills, night sweats, weight loss, swollen lymph nodes, body aches, joint swelling, muscle aches, chest pain, shortness of breath, mood changes, visual or auditory hallucinations.   Objective:   General: Well Developed, well nourished, and in no acute distress.  Neuro/Psych: Alert and oriented x3, extra-ocular muscles intact, able to move all 4 extremities, sensation grossly intact. Skin: Warm and dry, no rashes noted.  Respiratory: Not using accessory muscles, speaking in full sentences, trachea midline.  Cardiovascular: Pulses palpable, no extremity edema. Abdomen: Does not appear distended. Left Hip: ROM IR: 30 Deg, ER: 45 Deg, Flexion: 120 Deg, Extension: 100 Deg, Abduction: 45 Deg, Adduction: 45 Deg, there is reproduction of his pain with internal rotation of the left hip. Strength IR: 5/5, ER: 5/5, Flexion: 5/5, Extension: 5/5, Abduction: 5/5, Adduction: 5/5, he does have 5 out of 5 strength, with some reproduction of pain to flexion. I can palpate the retracted end of his rectus femoris muscle over  the anterior thigh. Pelvic alignment unremarkable to inspection and palpation. Standing hip rotation and gait without trendelenburg sign / unsteadiness. Greater trochanter without tenderness to palpation. No tenderness over piriformis and greater trochanter. No pain with FABER or FADIR. No SI joint tenderness and normal minimal SI movement.  Procedure: Real-time Ultrasound Guided Injection of left femoroacetabular joint. Device: GE Logiq E  Ultrasound guided injection is preferred based studies that show increased duration, increased effect, greater accuracy, decreased procedural pain, increased response rate, and decreased cost with ultrasound guided versus blind injection.  Verbal informed consent obtained.  Time-out conducted.  Noted no overlying erythema, induration, or other signs of local infection.  Skin prepped in a sterile fashion.  Local anesthesia: Topical Ethyl chloride.  With sterile technique and under real time ultrasound guidance:  Spinal needle advanced under guidance to the femoral head neck junction on the left side. 2 cc Kenalog 40, 4 cc lidocaine injected and joint capsule seen filling with fluid. Completed without difficulty  Pain partially resolved suggesting accurate placement of the medication.  Advised to call if fevers/chills, erythema, induration, drainage, or persistent bleeding.  Images permanently stored and available for review in the ultrasound unit.  Impression: Technically successful ultrasound guided injection. Impression and Recommendations:   This case required medical decision making of moderate complexity.

## 2012-12-14 ENCOUNTER — Ambulatory Visit: Payer: BC Managed Care – PPO | Admitting: Family Medicine

## 2012-12-21 ENCOUNTER — Ambulatory Visit: Payer: BC Managed Care – PPO | Admitting: Sports Medicine

## 2013-01-04 ENCOUNTER — Ambulatory Visit (INDEPENDENT_AMBULATORY_CARE_PROVIDER_SITE_OTHER): Payer: BC Managed Care – PPO | Admitting: Family Medicine

## 2013-01-04 ENCOUNTER — Encounter: Payer: Self-pay | Admitting: Family Medicine

## 2013-01-04 VITALS — BP 126/78 | HR 65 | Ht 70.0 in | Wt 306.0 lb

## 2013-01-04 DIAGNOSIS — R7301 Impaired fasting glucose: Secondary | ICD-10-CM

## 2013-01-04 DIAGNOSIS — I10 Essential (primary) hypertension: Secondary | ICD-10-CM

## 2013-01-04 DIAGNOSIS — E785 Hyperlipidemia, unspecified: Secondary | ICD-10-CM

## 2013-01-04 LAB — POCT GLYCOSYLATED HEMOGLOBIN (HGB A1C): Hemoglobin A1C: 6.2

## 2013-01-04 MED ORDER — LOSARTAN POTASSIUM 50 MG PO TABS: 50.0000 mg | ORAL_TABLET | Freq: Every day | ORAL | Status: DC

## 2013-01-04 MED ORDER — AMLODIPINE BESYLATE 10 MG PO TABS: 10.0000 mg | ORAL_TABLET | Freq: Every day | ORAL | Status: DC

## 2013-01-04 MED ORDER — ATORVASTATIN CALCIUM 80 MG PO TABS: 80.0000 mg | ORAL_TABLET | Freq: Every day | ORAL | Status: DC

## 2013-01-04 NOTE — Progress Notes (Signed)
  Subjective:    Patient ID: Donald Olson, male    DOB: Sep 18, 1965, 47 y.o.   MRN: [id number]  HPI Impaired fasting glucose - working out some.  Has made some dietary changes but nothing dramatic. No inc thrist or urination. Still eating candy bars.    Hypertension- Pt denies chest pain, SOB, dizziness, or heart palpitations.  Taking meds as directed w/o problems.  Denies medication side effects.    Hyperlipidemia-Taking statin and tolerating well.    Review of Systems     Objective:   Physical Exam  Constitutional: He is oriented to person, place, and time. He appears well-developed and well-nourished.  HENT:  Head: Normocephalic and atraumatic.  Cardiovascular: Normal rate, regular rhythm and normal heart sounds.   Pulmonary/Chest: Effort normal and breath sounds normal.  Neurological: He is alert and oriented to person, place, and time.  Skin: Skin is warm and dry.  Psychiatric: He has a normal mood and affect. His behavior is normal.          Assessment & Plan:  Impaired fasting glucose-hemoglobin A1c is improved on the 6.2 today which is fantastic. Continue with exercise and diet and followup in 6 months. Contineue to work on cutting back on carbs. He still been eating a lot of sweets he has been working out regularly which is fantastic which is probably why his A1c is down even though he's actually gained 6 pounds.  Hypertension-well-controlled. Continue current regimen. Followup in 6 months.  Hyperlipidemia-Recheck in 6 months.  Work on weight loss.  He wanst to lose 20 lbs in 3 months. Encouraged about 1 lb per week.  Lab Results  Component Value Date   CHOL 212* 06/15/2012   HDL 36* 06/15/2012   LDLCALC 149* 06/15/2012   LDLDIRECT 118* 04/01/2011   TRIG 136 06/15/2012   CHOLHDL 5.9 06/15/2012

## 2013-04-05 ENCOUNTER — Encounter: Payer: Self-pay | Admitting: Family Medicine

## 2013-04-05 ENCOUNTER — Ambulatory Visit (INDEPENDENT_AMBULATORY_CARE_PROVIDER_SITE_OTHER): Payer: BC Managed Care – PPO | Admitting: Family Medicine

## 2013-04-05 VITALS — BP 133/81 | HR 49 | Wt 292.0 lb

## 2013-04-05 DIAGNOSIS — R7301 Impaired fasting glucose: Secondary | ICD-10-CM

## 2013-04-05 DIAGNOSIS — I10 Essential (primary) hypertension: Secondary | ICD-10-CM

## 2013-04-05 LAB — POCT GLYCOSYLATED HEMOGLOBIN (HGB A1C): Hemoglobin A1C: 6

## 2013-04-05 NOTE — Progress Notes (Signed)
  Subjective:    Patient ID: Rozetta Nunnery, male    DOB: [birth year], 47 y.o.   MRN: [id number]  HPI HTN-  Pt denies chest pain, SOB, dizziness, or heart palpitations.  Taking meds as directed w/o problems.  Denies medication side effects.   IFG - He is working out Psychologist, counselling. Trying to eat healthy.    Review of Systems     Objective:   Physical Exam  Constitutional: He is oriented to person, place, and time. He appears well-developed and well-nourished.  HENT:  Head: Normocephalic and atraumatic.  Cardiovascular: Normal rate, regular rhythm and normal heart sounds.   Pulmonary/Chest: Effort normal and breath sounds normal.  Musculoskeletal: He exhibits no edema.  Neurological: He is alert and oriented to person, place, and time.  Skin: Skin is warm and dry.  Psychiatric: He has a normal mood and affect. His behavior is normal.          Assessment & Plan:  HTN- well controlled. Continue current regimen. Followup in 6 months.  Impaired fasting glucose- He has lost 14 lbs since last here 3 months ago.  His A1c is down to 6.0 which is fantastic progress. Followup in 6 months. Continue work on [employer], weight loss and exercise.

## 2013-05-12 ENCOUNTER — Emergency Department
Admission: EM | Admit: 2013-05-12 | Discharge: 2013-05-12 | Disposition: A | Discharge status: Home / Self Care | Payer: BC Managed Care – PPO | Source: Home / Self Care | Attending: Family Medicine | Admitting: Family Medicine

## 2013-05-12 ENCOUNTER — Encounter: Payer: Self-pay | Admitting: *Deleted

## 2013-05-12 DIAGNOSIS — J069 Acute upper respiratory infection, unspecified: Secondary | ICD-10-CM

## 2013-05-12 DIAGNOSIS — H73012 Bullous myringitis, left ear: Secondary | ICD-10-CM

## 2013-05-12 DIAGNOSIS — H73019 Bullous myringitis, unspecified ear: Secondary | ICD-10-CM

## 2013-05-12 MED ORDER — AMOXICILLIN 875 MG PO TABS: 875.0000 mg | ORAL_TABLET | Freq: Two times a day (BID) | ORAL | Status: DC

## 2013-05-12 MED ORDER — BENZONATATE 200 MG PO CAPS: 200.0000 mg | ORAL_CAPSULE | Freq: Every day | ORAL | Status: DC

## 2013-05-12 NOTE — ED Provider Notes (Signed)
CSN: [id number]     Arrival date & time 05/12/13  1802 History   First MD Initiated Contact with Patient 05/12/13 1817     Chief Complaint  Patient presents with  . Otalgia     HPI Comments: Patient developed a sore throat (now resolved) six days ago, followed by cough and nasal congestion.  He developed a left earache four days ago.  He has had a low grade fever.  The history is provided by the patient.    Past Medical History  Diagnosis Date  . Hypertension   . Obesity   . Hyperlipidemia    Past Surgical History  Procedure Laterality Date  . Appendectomy  21  . Patellar tendon surgery      bilateral  . Eye surgery     Family History  Problem Relation Age of Onset  . Cancer Father 87    pancreatic  . Hypertension Father   . Hypertension Brother   . Diabetes      cousin/uncle/ERSD   History  Substance Use Topics  . Smoking status: Never Smoker   . Smokeless tobacco: Not on file  . Alcohol Use: No    Review of Systems + sore throat + cough + sneezing No pleuritic pain No wheezing + nasal congestion + post-nasal drainage No sinus pain/pressure No itchy/red eyes + left earache No hemoptysis No SOB + low grade fever, + chills No nausea No vomiting No abdominal pain No diarrhea No urinary symptoms No skin rashes + fatigue No myalgias No headache Used OTC meds without relief  Allergies  Review of patient's allergies indicates no known allergies.  Home Medications   Current Outpatient Rx  Name  Route  Sig  Dispense  Refill  . amLODipine (NORVASC) 10 MG tablet   Oral   Take 1 tablet (10 mg total) by mouth daily.   90 tablet   1   . amoxicillin (AMOXIL) 875 MG tablet   Oral   Take 1 tablet (875 mg total) by mouth 2 (two) times daily.   20 tablet   0   . atorvastatin (LIPITOR) 80 MG tablet   Oral   Take 1 tablet (80 mg total) by mouth daily.   90 tablet   1   . benzonatate (TESSALON) 200 MG capsule   Oral   Take 1 capsule (200 mg total)  by mouth at bedtime.   12 capsule   0   . losartan (COZAAR) 50 MG tablet   Oral   Take 1 tablet (50 mg total) by mouth daily.   90 tablet   1    BP 153/90  Pulse 64  Temp(Src) 98.2 F (36.8 C) (Oral)  Resp 18  Ht 5\' 10"  (1.778 m)  Wt 287 lb (130.182 kg)  BMI 41.18 kg/m2  SpO2 99% Physical Exam Nursing notes and Vital Signs reviewed. Appearance:  Patient appears stated age, and in no acute distress. Patient is obese (BMI 41.2) Eyes:  Pupils are equal, round, and reactive to light and accomodation.  Extraocular movement is intact.  Conjunctivae are not inflamed  Ears:  Canals normal.  Right tympanic membrane normal.  Left tympanic membrane is erythematous and slightly bulging with a bulla present.  Nose:  Mildly congested turbinates.  No sinus tenderness.    Pharynx:  Normal Neck:  Supple.  Tender shotty posterior nodes are palpated bilaterally  Lungs:  Clear to auscultation.  Breath sounds are equal.  Heart:  Regular rate and rhythm without murmurs,  rubs, or gallops.  Abdomen:  Nontender without masses or hepatosplenomegaly.  Bowel sounds are present.  No CVA or flank tenderness.  Extremities:  No edema.  No calf tenderness Skin:  No rash present.   ED Course  Procedures  none        MDM   1. Bullous myringitis of left ear   2. Acute upper respiratory infections of unspecified site    Begin amoxicillin Prescription written for Benzonatate (Tessalon) to take at bedtime for night-time cough.  Take plain Mucinex 1200mg  (guaifenesin) twice daily for cough and congestion.  Increase fluid intake, rest. May add Sudafed for sinus congestion. May use Afrin nasal spray (or generic oxymetazoline) twice daily for about 5 days.  Also recommend using saline nasal spray several times daily and saline nasal irrigation (AYR is a common brand) Stop all antihistamines for now, and other non-prescription cough/cold preparations. Follow-up with family doctor if not improving 7 to 10 days.      Lattie Haw, MD 05/17/13 1017

## 2013-05-12 NOTE — ED Notes (Signed)
Pt c/o LT ear pain x 2 days. He also reports having a URI recently.

## 2013-08-16 ENCOUNTER — Emergency Department
Admission: EM | Admit: 2013-08-16 | Discharge: 2013-08-16 | Disposition: A | Discharge status: Home / Self Care | Payer: BC Managed Care – PPO | Source: Home / Self Care | Attending: Family Medicine | Admitting: Family Medicine

## 2013-08-16 ENCOUNTER — Encounter: Payer: Self-pay | Admitting: Emergency Medicine

## 2013-08-16 DIAGNOSIS — J020 Streptococcal pharyngitis: Secondary | ICD-10-CM

## 2013-08-16 DIAGNOSIS — J029 Acute pharyngitis, unspecified: Secondary | ICD-10-CM

## 2013-08-16 MED ORDER — PENICILLIN V POTASSIUM 500 MG PO TABS: ORAL_TABLET | ORAL | Status: DC

## 2013-08-16 NOTE — ED Notes (Signed)
Donald Olson c/o sore throat and fever x 2-3 days. No flu vaccine this year. Taken day/nyquil and advil otc.

## 2013-08-16 NOTE — ED Provider Notes (Signed)
CSN: [id number]     Arrival date & time 08/16/13  0815 History   First MD Initiated Contact with Patient 08/16/13 [phone]     Chief Complaint  Patient presents with  . Sore Throat      HPI Comments: Patient complains of 3 day history of sore throat with chills, fatigue, and headache.  No cold-like symptoms.  The history is provided by the patient.    Past Medical History  Diagnosis Date  . Hypertension   . Obesity   . Hyperlipidemia    Past Surgical History  Procedure Laterality Date  . Appendectomy  21  . Patellar tendon surgery      bilateral  . Eye surgery     Family History  Problem Relation Age of Onset  . Cancer Father 85    pancreatic  . Hypertension Father   . Hypertension Brother   . Diabetes      cousin/uncle/ERSD   History  Substance Use Topics  . Smoking status: Never Smoker   . Smokeless tobacco: Never Used  . Alcohol Use: No    Review of Systems + sore throat No cough No pleuritic pain No wheezing No nasal congestion No post-nasal drainage No sinus pain/pressure No itchy/red eyes No earache No hemoptysis No SOB No fever, + chills No nausea No vomiting No abdominal pain No diarrhea No urinary symptoms No skin rash + fatigue No myalgias + headache Used OTC meds without relief  Allergies  Review of patient's allergies indicates no known allergies.  Home Medications   Current Outpatient Rx  Name  Route  Sig  Dispense  Refill  . amLODipine (NORVASC) 10 MG tablet   Oral   Take 1 tablet (10 mg total) by mouth daily.   90 tablet   1   . atorvastatin (LIPITOR) 80 MG tablet   Oral   Take 1 tablet (80 mg total) by mouth daily.   90 tablet   1   . losartan (COZAAR) 50 MG tablet   Oral   Take 1 tablet (50 mg total) by mouth daily.   90 tablet   1   . penicillin v potassium (VEETID) 500 MG tablet      Take one tab by mouth twice daily for 10 days   20 tablet   0    BP 149/90  Pulse 70  Temp(Src) 98.2 F (36.8 C) (Oral)   Resp 16  Ht 5\' 10"  (1.778 m)  Wt 279 lb (126.554 kg)  BMI 40.03 kg/m2  SpO2 99% Physical Exam Nursing notes and Vital Signs reviewed. Appearance:  Patient appears stated age, and in no acute distress.  Patient is obese (BMI 40.0) Eyes:  Pupils are equal, round, and reactive to light and accomodation.  Extraocular movement is intact.  Conjunctivae are not inflamed  Ears:  Canals normal.  Tympanic membranes normal.  Nose:   Normal turbinates.  No sinus tenderness.     Pharynx:  Erythematous and slightly swollen without obstruction.  No exudate Neck:  Supple.   Tender enlarged anterior nodes bilaterally Lungs:  Clear to auscultation.  Breath sounds are equal.  Heart:  Regular rate and rhythm without murmurs, rubs, or gallops.  Skin:  No rash present.   ED Course  Procedures  none    Labs Reviewed  POCT RAPID STREP A (OFFICE) - Abnormal; Notable for the following:    Rapid Strep A Screen Positive (*)  MDM   1. Streptococcal sore throat   2. Acute pharyngitis    Begin PenVK Take Ibuprofen 200mg , 4 tabs every 8 hours with food for sore throat.  Try warm salt water gargles.  If symptoms become significantly worse during the night or over the weekend, proceed to the local emergency room. Discussed red flags    Lattie Haw, MD 08/16/13 [phone]

## 2013-08-19 ENCOUNTER — Telehealth: Payer: Self-pay | Admitting: Emergency Medicine

## 2013-09-24 ENCOUNTER — Other Ambulatory Visit: Payer: Self-pay | Admitting: *Deleted

## 2013-09-24 MED ORDER — ATORVASTATIN CALCIUM 80 MG PO TABS: 80.0000 mg | ORAL_TABLET | Freq: Every day | ORAL | Status: DC

## 2013-09-24 NOTE — Telephone Encounter (Signed)
Pt scheduled for appt on the 26th.  Meyer CoryMisty Armany Mano, LPN

## 2013-10-04 ENCOUNTER — Encounter: Payer: Self-pay | Admitting: Family Medicine

## 2013-10-04 ENCOUNTER — Ambulatory Visit (INDEPENDENT_AMBULATORY_CARE_PROVIDER_SITE_OTHER): Payer: BC Managed Care – PPO | Admitting: Family Medicine

## 2013-10-04 VITALS — BP 144/82 | HR 61 | Temp 97.8°F | Ht 70.0 in | Wt 282.0 lb

## 2013-10-04 DIAGNOSIS — R7301 Impaired fasting glucose: Secondary | ICD-10-CM

## 2013-10-04 DIAGNOSIS — I10 Essential (primary) hypertension: Secondary | ICD-10-CM

## 2013-10-04 DIAGNOSIS — E785 Hyperlipidemia, unspecified: Secondary | ICD-10-CM

## 2013-10-04 LAB — LIPID PANEL
Cholesterol: 152 mg/dL (ref 0–200)
HDL: 36 mg/dL — ABNORMAL LOW (ref >39)
LDL Cholesterol: 93 mg/dL (ref 0–99)
TRIGLYCERIDES: 115 mg/dL (ref <150)
Total CHOL/HDL Ratio: 4.2 Ratio
VLDL: 23 mg/dL (ref 0–40)

## 2013-10-04 LAB — COMPLETE METABOLIC PANEL WITH GFR
ALT: 24 U/L (ref 0–53)
AST: 21 U/L (ref 0–37)
Albumin: 4.8 g/dL (ref 3.5–5.2)
Alkaline Phosphatase: 66 U/L (ref 39–117)
BUN: 10 mg/dL (ref 6–23)
CALCIUM: 9.6 mg/dL (ref 8.4–10.5)
CO2: 28 meq/L (ref 19–32)
CREATININE: 0.95 mg/dL (ref 0.50–1.35)
Chloride: 102 mEq/L (ref 96–112)
GFR, Est Non African American: >89 mL/min
Glucose, Bld: 97 mg/dL (ref 70–99)
Potassium: 4.4 mEq/L (ref 3.5–5.3)
Sodium: 138 mEq/L (ref 135–145)
Total Bilirubin: 0.9 mg/dL (ref 0.3–1.2)
Total Protein: 7.6 g/dL (ref 6.0–8.3)

## 2013-10-04 LAB — POCT GLYCOSYLATED HEMOGLOBIN (HGB A1C): HEMOGLOBIN A1C: 5.8

## 2013-10-04 MED ORDER — AMLODIPINE BESYLATE 10 MG PO TABS: 10.0000 mg | ORAL_TABLET | Freq: Every day | ORAL | Status: DC

## 2013-10-04 MED ORDER — ATORVASTATIN CALCIUM 80 MG PO TABS: 80.0000 mg | ORAL_TABLET | Freq: Every day | ORAL | Status: DC

## 2013-10-04 MED ORDER — LOSARTAN POTASSIUM 100 MG PO TABS: 100.0000 mg | ORAL_TABLET | Freq: Every day | ORAL | Status: DC

## 2013-10-04 NOTE — Progress Notes (Signed)
   Subjective:    Patient ID: Donald Olson, male    DOB: 1966/06/24, 48 y.o.   MRN: [id number]  HPI Hypertension- Pt denies chest pain, SOB, dizziness, or heart palpitations.  Taking meds as directed w/o problems.  Denies medication side effects.    F/U IFG - due for labs as well. No inc thrist or urination.  Lab Results  Component Value Date   HGBA1C 5.8 10/04/2013     Hyperlipidemia - Doing well on statin.  No myalgias or S.E.    Review of Systems     Objective:   Physical Exam  Constitutional: He is oriented to person, place, and time. He appears well-developed and well-nourished.  HENT:  Head: Normocephalic and atraumatic.  Neck: Neck supple.  Cardiovascular: Normal rate, regular rhythm and normal heart sounds.   Pulmonary/Chest: Effort normal and breath sounds normal.  Neurological: He is alert and oriented to person, place, and time.  Skin: Skin is warm and dry.  Psychiatric: He has a normal mood and affect. His behavior is normal.          Assessment & Plan:  HTN - Uncontrolled. Will increase losartan to 100mg .  F/U in 6 moths.   IFG - Well controlle.d continues to improve his numbers. Great job!! F/U in 6 mo Lab Results  Component Value Date   HGBA1C 5.8 10/04/2013     Hyperlipidemia - tolerating statin well without any side effects or problems. Due for repeat lipids and liver enzymes today.

## 2014-04-04 ENCOUNTER — Ambulatory Visit: Payer: BC Managed Care – PPO | Admitting: Family Medicine

## 2014-05-09 ENCOUNTER — Ambulatory Visit: Payer: BC Managed Care – PPO | Admitting: Family Medicine

## 2014-06-13 ENCOUNTER — Ambulatory Visit (INDEPENDENT_AMBULATORY_CARE_PROVIDER_SITE_OTHER): Payer: BC Managed Care – PPO | Admitting: Family Medicine

## 2014-06-13 ENCOUNTER — Encounter: Payer: Self-pay | Admitting: Family Medicine

## 2014-06-13 VITALS — BP 135/85 | HR 56 | Temp 97.6°F | Resp 16 | Wt 281.0 lb

## 2014-06-13 DIAGNOSIS — E785 Hyperlipidemia, unspecified: Secondary | ICD-10-CM

## 2014-06-13 DIAGNOSIS — Z23 Encounter for immunization: Secondary | ICD-10-CM

## 2014-06-13 DIAGNOSIS — R7301 Impaired fasting glucose: Secondary | ICD-10-CM | POA: Diagnosis not present

## 2014-06-13 DIAGNOSIS — I10 Essential (primary) hypertension: Secondary | ICD-10-CM | POA: Diagnosis not present

## 2014-06-13 LAB — BASIC METABOLIC PANEL WITH GFR
BUN: 8 mg/dL (ref 6–23)
CALCIUM: 10 mg/dL (ref 8.4–10.5)
CHLORIDE: 99 meq/L (ref 96–112)
CO2: 29 meq/L (ref 19–32)
CREATININE: 0.92 mg/dL (ref 0.50–1.35)
GFR, Est African American: >89 mL/min
GFR, Est Non African American: >89 mL/min
Glucose, Bld: 96 mg/dL (ref 70–99)
Potassium: 4.4 mEq/L (ref 3.5–5.3)
Sodium: 139 mEq/L (ref 135–145)

## 2014-06-13 LAB — POCT GLYCOSYLATED HEMOGLOBIN (HGB A1C): Hemoglobin A1C: 6.1

## 2014-06-13 MED ORDER — LOSARTAN POTASSIUM 100 MG PO TABS: 100.0000 mg | ORAL_TABLET | Freq: Every day | ORAL | Status: DC

## 2014-06-13 MED ORDER — AMLODIPINE BESYLATE 10 MG PO TABS: 10.0000 mg | ORAL_TABLET | Freq: Every day | ORAL | Status: DC

## 2014-06-13 MED ORDER — ATORVASTATIN CALCIUM 80 MG PO TABS: 80.0000 mg | ORAL_TABLET | Freq: Every day | ORAL | Status: DC

## 2014-06-13 NOTE — Assessment & Plan Note (Signed)
A1C is up to 6.1 today. Discussed the importance of diet, exercise and weight loss.  F/U in 3-4 months.

## 2014-06-13 NOTE — Addendum Note (Signed)
Addended by: Deno EtienneBARKLEY, Sriya Kroeze L on: 06/13/2014 11:56 AM   Modules accepted: Orders, Medications

## 2014-06-13 NOTE — Assessment & Plan Note (Signed)
Well controlled.  F/u in 6 months.  Doing well on inc dose of 100mg . Due for bmp

## 2014-06-13 NOTE — Progress Notes (Signed)
   Subjective:    Patient ID: Donald Olson, male    DOB: 1965/12/27, 48 y.o.   MRN: [id number]  HPI IFG -  No inc thrist or urination. [place] A1[place]  5[place]  Hasn't been following diet and exercise.    Hypertension- Increased his losartan to 100mg  in January. Pt denies chest pain, SOB, dizziness, or heart palpitations.  Taking meds as directed w/o problems.  Denies medication side effects.    Hyperlpidemia - ON Lipitor 80mg  QHS.  Tolerating well w/o any S.E. Lab Results  Component Value Date   CHOL 152 10/04/2013   HDL 36* 10/04/2013   LDLCALC 93 10/04/2013   LDLDIRECT 118* 04/01/2011   TRIG 115 10/04/2013   CHOLHDL 4.2 10/04/2013      Review of Systems     Objective:   Physical Exam  Constitutional: He is oriented to person, place, and time. He appears well-developed and well-nourished.  HENT:  Head: Normocephalic and atraumatic.  Cardiovascular: Normal rate, regular rhythm and normal heart sounds.   Pulmonary/Chest: Effort normal and breath sounds normal.  Neurological: He is alert and oriented to person, place, and time.  Skin: Skin is warm and dry.  Psychiatric: He has a normal mood and affect. His behavior is normal.          Assessment & Plan:  Flu vaccine given.

## 2014-06-13 NOTE — Assessment & Plan Note (Signed)
Looked gret in January. tolreating med well. Repeat labs in January.

## 2014-06-14 NOTE — Progress Notes (Signed)
Quick Note:  All labs are normal. ______ 

## 2014-10-10 ENCOUNTER — Ambulatory Visit: Payer: BC Managed Care – PPO | Admitting: Family Medicine

## 2014-11-05 ENCOUNTER — Emergency Department (INDEPENDENT_AMBULATORY_CARE_PROVIDER_SITE_OTHER)
Admission: EM | Admit: 2014-11-05 | Discharge: 2014-11-05 | Disposition: A | Discharge status: Home / Self Care | Payer: BLUE CROSS/BLUE SHIELD | Source: Home / Self Care

## 2014-11-05 DIAGNOSIS — S46112A Strain of muscle, fascia and tendon of long head of biceps, left arm, initial encounter: Secondary | ICD-10-CM

## 2014-11-05 DIAGNOSIS — S46212A Strain of muscle, fascia and tendon of other parts of biceps, left arm, initial encounter: Secondary | ICD-10-CM

## 2014-11-05 MED ORDER — HYDROCODONE-ACETAMINOPHEN 5-325 MG PO TABS: ORAL_TABLET | ORAL | Status: DC

## 2014-11-05 MED ORDER — MELOXICAM 15 MG PO TABS: 15.0000 mg | ORAL_TABLET | Freq: Every day | ORAL | Status: DC

## 2014-11-05 NOTE — Discharge Instructions (Signed)
Apply ice pack for 15 to 20 minutes, 3 to 4 times daily  Continue until pain decreases.  Wear sling.   Biceps Tendon Disruption (Distal) with Rehab The biceps tendon attaches the biceps muscle to the bones of the elbow and the shoulder. A distal biceps tendon disruption is a tear of this tendon at the end the attached near the elbow. A distal biceps tendon rupture is an uncommon injury. These injuries usually involve a complete tear of the tendon from the bone; however, partial tears are also possible. The bicep muscle works with other muscles to bend the elbow and rotate the palm upward (supinate). A complete biceps rupture will result in approximately a 30% decrease in elbow bending strength and a 40% decrease in one's ability to supinate the wrist. SYMPTOMS   Pain, tenderness, swelling, warmth, or redness at the elbow, usually in the front of the elbow.  Pain that worsens with flexion of the elbow against resistance and when straightening the elbow.  Bulge can be seen and felt in the arm.  Bruising (contusion) in the elbow or forearm after 24 hours.  Limited motion of the elbow.  Weakness with attempted elbow bending (lifting or carrying) or rotation of the wrist (like when using a screwdriver).  A crackling sound (crepitation) when the tendon or elbow is moved or touched. CAUSES  A biceps tendon rupture occurs when the tendon is subjected to a force that is greater than it can withstand, such as straightening the elbow while the biceps is contracted or direct trauma (rare). RISK INCREASES WITH:   Sports that involve contact, as well as throwing sports, gymnastics, weightlifting, and bodybuilding.  Heavy labor.  Poor strength and flexibility.  Failure to warm-up properly before activity. PREVENTION  Warm up and stretch appropriately before activity.  Maintain physical fitness:  Strength, flexibility, and endurance.  Cardiovascular fitness  Allow your body to recover  between practices and competition.  Learn and use proper technique. PROGNOSIS  Surgery is usually required to fix distal biceps tendon rupture. After surgery, a recovery period of 4 to 8 months can be expected to allow for healing and a return to sports.  RELATED COMPLICATIONS  Weakness of elbow bending and forearm rotation, especially if treated non-surgically.  Prolonged disability.  Re-rupture of the tendon after surgery.  Risks of surgery, including infection, bleeding, injury to nerves, elbow or wrist stiffness or loss of motion, and weakness of elbow bending or wrist rotation. TREATMENT  Treatment initially consists of ice and medication to help reduce pain and inflammation. A sling may also be worn to increase one's comfort. Surgery is required for a full recovery and return to sports. Surgery involves reattaching the tendon to the bone. Weakness can be expected if surgery is not performed; however, this may be acceptable for sedentary individuals. Surgery is usually followed by immobilization and rehabilitation exercises to regain strength and a full range of motion.  MEDICATION  If pain medication is necessary, nonsteroidal anti-inflammatory medications, such as aspirin and ibuprofen, or other minor pain relievers, such as acetaminophen, are often recommended.  Do not take pain medication for 7 days before surgery.  Prescription pain relievers may be given if deemed necessary by your caregiver. Use only as directed and only as much as you need. HEAT AND COLD  Cold treatment (icing) relieves pain and reduces inflammation. Cold treatment should be applied for 10 to 15 minutes every 2 to 3 hours for inflammation and pain and immediately after any activity that  aggravates your symptoms. Use ice packs or an ice massage.  Heat treatment may be used prior to performing the stretching and strengthening activities prescribed by your caregiver, physical therapist, or athletic trainer. Use a  heat pack or a warm soak. SEEK MEDICAL CARE IF:   Symptoms get worse or do not improve in 2 weeks despite treatment.  You experience pain, numbness, or coldness in the hand.  Blue, gray, or dark color appears in the fingernails.  Any of the following occur after surgery:  Increased pain, swelling, redness, drainage, or bleeding in the surgical area.  Signs of infection (headache, muscle aches, dizziness, or a general ill feeling with fever).  New, unexplained symptoms develop (drugs used in treatment may produce side effects). EXERCISES RANGE OF MOTION (ROM) AND STRETCHING EXERCISES - Biceps Tendon Disruption (Distal) Once your physician, physical therapist or athletic trainer has permitted you to discontinue using your brace or splint, you may begin to restore your elbow motion by using these exercises. Beginning these exercises before your provider's approval may result in delayed healing. While completing these exercises, remember:   Restoring tissue flexibility helps normal motion to return to the joints. This allows healthier, less painful movement and activity.  An effective stretch should be held for at least 30 seconds.  A stretch should never be painful. You should only feel a gentle lengthening or release in the stretched tissue. RANGE OF MOTION - Extension  Hold your right / left arm at your side and straighten your elbow as far as you can using your right / left arm muscles.  Straighten the right / left elbow farther by gently pushing down on your forearm until you feel a gentle stretch on the inside of your elbow. Hold this position for __________ seconds.  Slowly return to the starting position. Repeat __________ times. Complete this exercise __________ times per day.  RANGE OF MOTION - Flexion  Hold your right / left arm at your side and bend your elbow as far as you can using your right / left arm muscles.  Bend the right / left elbow farther by gently pushing up  on your forearm until you feel a gentle stretch on the outside of your elbow. Hold this position for __________ seconds.  Slowly return to the starting position. Repeat __________ times. Complete this exercise __________ times per day.  RANGE OF MOTION - Supination, Active   Stand or sit with your elbows at your side. Bend your right / left elbow to 90 degrees.  Turn your palm upward until you feel a gentle stretch on the inside of your forearm.  Hold this position for __________ seconds. Slowly release and return to the starting position. Repeat __________ times. Complete this stretch __________ times per day.  RANGE OF MOTION - Pronation, Active   Stand or sit with your elbows at your side. Bend your right / left elbow to 90 degrees.  Turn your palm downward until you feel a gentle stretch on the top of your forearm.  Hold this position for __________ seconds. Slowly release and return to the starting position. Repeat __________ times. Complete this stretch __________ times per day.  STRENGTHENING EXERCISES - Biceps Tendon Disruption (Distal) Once your physician, physical therapist, or athletic trainer has permitted you to discontinue using your brace or splint, you may begin restoring your arm strength by using these exercises. Beginning these before your provider's approval may result in delayed healing. While completing these exercises, remember:   Muscles can  gain both the endurance and the strength needed for everyday activities through controlled exercises.  Complete these exercises as instructed by your physician, physical therapist, or athletic trainer. Progress the resistance and repetitions only as guided.  You may experience muscle soreness or fatigue, but the pain or discomfort you are trying to eliminate should never worsen during these exercises. If this pain does worsen, stop and make certain you are following the directions exactly. If the pain is still present after  adjustments, discontinue the exercise until you can discuss the trouble with your clinician. STRENGTH - Elbow Flexors, Isometric   Stand or sit upright on a firm surface. Place your right / left arm so that your hand is palm-up and at the height of your waist.  Place your opposite hand on top of your forearm. Gently push down as your right / left arm resists. Push as hard as you can with both arms without causing any pain or movement at your right / left elbow. Hold this stationary position for __________ seconds.  Gradually release the tension in both arms. Allow your muscles to relax completely before repeating. Repeat __________ times. Complete this exercise __________ times per day. STRENGTH - Elbow Extensors, Isometric   Stand or sit upright on a firm surface. Place your right / left arm so that your palm faces your abdomen and it is at the height of your waist.  Place your opposite hand on the underside of your forearm. Gently push up as your right / left arm resists. Push as hard as you can with both arms without causing any pain or movement at your right / left elbow. Hold this stationary position for __________ seconds.  Gradually release the tension in both arms. Allow your muscles to relax completely before repeating. Repeat __________ times. Complete this exercise __________ times per day. STRENGTH - Elbow Flexors, Supinated  With good posture, stand or sit on a firm chair without armrests. Allow your right / left arm to rest at your side with your palm facing forward.  Holding a __________ weight or gripping a rubber exercise band/tubing, bring your right / left hand toward your shoulder.  Allow your muscles to control the resistance as your hand returns to your side. Repeat __________ times. Complete this exercise __________ times per day.  STRENGTH - Elbow Flexors, Neutral  With good posture, stand or sit on a firm chair without armrests. Allow your right / left arm to rest  at your side with your thumb facing forward.  Holding a __________ weight or gripping a rubber exercise band/tubing, bring your right / left hand toward your shoulder.  Allow your muscles to control the resistance as your hand returns to your side. Repeat __________ times. Complete this exercise __________ times per day.  STRENGTH - Elbow Extensors  Lie on your back. Extend your right / left elbow into the air, pointing it toward the ceiling. Brace your arm with your opposite hand.*  Holding a __________ weight in your hand, slowly straighten your right / left elbow.  Allow your muscles to control the weight as your hand returns to its starting position. Repeat __________ times. Complete this exercise __________ times per day. *You may also stand with your elbow overhead and pointed toward the ceiling and supported by your opposite hand. STRENGTH - Elbow Extensors, Dynamic  With good posture, stand or sit on a firm chair without armrests. Keeping your upper arms at your side, bring both hands up to your right /  left shoulder while gripping a rubber exercise band/tubing. Your right / left hand should be just below the other hand.  Straighten your right / left elbow. Hold for __________ seconds.  Allow your muscles to control the rubber exercise band/tubing as your hand returns to your shoulder. Repeat __________ times. Complete this exercise __________ times per day. STRENGTH - Forearm Supinators   Sit with your right / left forearm supported on a table, keeping your elbow below shoulder height. Rest your hand over the edge, palm down.  Gently grip a hammer or a soup ladle.  Without moving your elbow, slowly turn your palm and hand upward to a "thumbs-up" position.  Hold this position for __________ seconds. Slowly return to the starting position. Repeat __________ times. Complete this exercise __________ times per day.  STRENGTH - Forearm Pronators   Sit with your right / left  forearm supported on a table, keeping your elbow below shoulder height. Rest your hand over the edge, palm up.  Gently grip a hammer or a soup ladle.  Without moving your elbow, slowly turn your palm and hand upward to a "thumbs-up" position.  Hold this position for __________ seconds. Slowly return to the starting position. Repeat __________ times. Complete this exercise __________ times per day.  Document Released: 08/26/2005 Document Revised: 11/18/2011 Document Reviewed: 12/08/2008 [facility] Patient Information 2015 [employer] [place]. This information is not intended to replace advice given to you by your health care provider. Make sure you discuss any questions you have with your health care provider.

## 2014-11-05 NOTE — ED Notes (Signed)
Patient injured left arm today while lifting a sofa sleeper.

## 2014-11-05 NOTE — ED Provider Notes (Signed)
CSN: [id number]     Arrival date & time 11/05/14  1736 History   None    Chief Complaint  Patient presents with  . Arm Injury    Left      HPI Comments: Patient was lifting a heavy sofa sleeper prior to visit when he felt a pulling sensation above his left elbow.  Patient is a 49 y.o. male presenting with arm injury. The history is provided by the patient.  Arm Injury Location:  Elbow Time since incident:  1 hour Injury: yes   Mechanism of injury comment:  Heavy lifting Elbow location:  L elbow Pain details:    Quality:  Aching   Radiates to:  Does not radiate   Severity:  Mild   Onset quality:  Sudden   Duration:  1 hour   Timing:  Constant   Progression:  Unchanged Chronicity:  New Handedness:  Right-handed Prior injury to area:  No Relieved by:  None tried Exacerbated by: flexing left elbow. Ineffective treatments:  None tried Associated symptoms: decreased range of motion, muscle weakness and swelling   Associated symptoms: no numbness, no stiffness and no tingling     Past Medical History  Diagnosis Date  . Hypertension   . Obesity   . Hyperlipidemia    Past Surgical History  Procedure Laterality Date  . Appendectomy  21  . Patellar tendon surgery      bilateral  . Eye surgery     Family History  Problem Relation Age of Onset  . Cancer Father 19    pancreatic  . Hypertension Father   . Hypertension Brother   . Diabetes      cousin/uncle/ERSD   History  Substance Use Topics  . Smoking status: Never Smoker   . Smokeless tobacco: Never Used  . Alcohol Use: No    Review of Systems  Musculoskeletal: Negative for stiffness.  All other systems reviewed and are negative.   Allergies  Review of patient's allergies indicates no known allergies.  Home Medications   Prior to Admission medications   Medication Sig Start Date End Date Taking? Authorizing Provider  amLODipine (NORVASC) 10 MG tablet Take 1 tablet (10 mg total) by mouth daily. 06/13/14   Yes Agapito Games, MD  atorvastatin (LIPITOR) 80 MG tablet Take 1 tablet (80 mg total) by mouth daily. 06/13/14  Yes Agapito Games, MD  losartan (COZAAR) 100 MG tablet Take 1 tablet (100 mg total) by mouth daily. 06/13/14 06/13/15 Yes Agapito Games, MD  HYDROcodone-acetaminophen (NORCO/VICODIN) 5-325 MG per tablet Take one by mouth at bedtime as needed for pain 11/05/14   Lattie Haw, MD  meloxicam (MOBIC) 15 MG tablet Take 1 tablet (15 mg total) by mouth daily. Take with food. 11/05/14   Lattie Haw, MD   BP 148/82 mmHg  Pulse 80  Temp(Src) 98.2 F (36.8 C) (Oral)  Ht  (1.803 m)  Wt 301 lb (136.533 kg)  BMI 42.00 kg/m2  SpO2 98% Physical Exam  Constitutional: He is oriented to person, place, and time. He appears well-developed and well-nourished. No distress.  HENT:  Head: Atraumatic.  Eyes: Pupils are equal, round, and reactive to light.  Musculoskeletal:       Left elbow: He exhibits normal range of motion, no swelling, no effusion and no deformity. Tenderness found.       Arms: Left elbow has full active range of motion but decreased strength with resisted flexion.  There is tenderness over  the distal biceps muscle and tendon as noted on diagram.  Good strength to supination and pronation.  Distal neurovascular function is intact.                                                                                                                                                                                                                                                                                                                                                                                     Neurological: He is alert and oriented to person, place, and time.  Skin: Skin is warm and dry.  Nursing note and vitals reviewed.   ED Course  Procedures  none     MDM   1. Biceps muscle tear, left, initial encounter    Dispensed sling.  Begin Mobic  15mg  QD.  Lortab at bedtime prn. Apply ice pack for 15 to 20 minutes, 3 to 4 times daily  Continue until pain decreases.  Wear sling. Followup with Dr. Rodney Langtonhomas Thekkekandam ([facility] in two days.     Lattie HawStephen A Cierra Rothgeb, MD 11/05/14 [id number]

## 2014-11-07 ENCOUNTER — Ambulatory Visit (INDEPENDENT_AMBULATORY_CARE_PROVIDER_SITE_OTHER): Payer: BLUE CROSS/BLUE SHIELD

## 2014-11-07 ENCOUNTER — Encounter: Payer: Self-pay | Admitting: Family Medicine

## 2014-11-07 ENCOUNTER — Ambulatory Visit (INDEPENDENT_AMBULATORY_CARE_PROVIDER_SITE_OTHER): Payer: BLUE CROSS/BLUE SHIELD | Admitting: Family Medicine

## 2014-11-07 VITALS — BP 143/80 | HR 57 | Ht 71.0 in | Wt 295.0 lb

## 2014-11-07 DIAGNOSIS — S46212A Strain of muscle, fascia and tendon of other parts of biceps, left arm, initial encounter: Secondary | ICD-10-CM

## 2014-11-07 DIAGNOSIS — M79602 Pain in left arm: Secondary | ICD-10-CM

## 2014-11-07 DIAGNOSIS — X58XXXA Exposure to other specified factors, initial encounter: Secondary | ICD-10-CM

## 2014-11-07 NOTE — Patient Instructions (Signed)
I'm concerned you tore your distal biceps tendon. These typically require surgery or you lose a lot of strength. We will go ahead with an MRI to confirm this. I will call you the business day following the MRI.

## 2014-11-07 NOTE — ED Notes (Signed)
Pt scheduled to see Dr. Pearletha ForgeHudnall 11/07/14 @ 10:30am. Pt notified of apt time and location, verbalized understanding.

## 2014-11-08 DIAGNOSIS — S46219A Strain of muscle, fascia and tendon of other parts of biceps, unspecified arm, initial encounter: Secondary | ICD-10-CM | POA: Insufficient documentation

## 2014-11-08 DIAGNOSIS — M109 Gout, unspecified: Secondary | ICD-10-CM | POA: Insufficient documentation

## 2014-11-08 NOTE — Assessment & Plan Note (Signed)
will go ahead with MRI to confirm and advised patient this will need surgical repair if present.  Sling, mobic and icing in meantime.

## 2014-11-08 NOTE — Progress Notes (Signed)
PCP: Nani GasserMETHENEY,CATHERINE, MD  Subjective:   HPI: Patient is a 49 y.o. male here for left biceps injury.  Patient reports on 2/27 he was lifting a sleeper sofa when he felt a pop just above anterior left elbow and had a charlie horse sensation here. Feels like a knot might be forming here. Strength relatively good. Pain up to 8/10 if tries to lift something now. Using sling, taking mobic, lortab and icing. No prior injuries. Is right handed.  Past Medical History  Diagnosis Date  . Hypertension   . Obesity   . Hyperlipidemia     Current Outpatient Prescriptions on File Prior to Visit  Medication Sig Dispense Refill  . amLODipine (NORVASC) 10 MG tablet Take 1 tablet (10 mg total) by mouth daily. 90 tablet 1  . atorvastatin (LIPITOR) 80 MG tablet Take 1 tablet (80 mg total) by mouth daily. 90 tablet 1  . HYDROcodone-acetaminophen (NORCO/VICODIN) 5-325 MG per tablet Take one by mouth at bedtime as needed for pain 10 tablet 0  . losartan (COZAAR) 100 MG tablet Take 1 tablet (100 mg total) by mouth daily. 90 tablet 1  . meloxicam (MOBIC) 15 MG tablet Take 1 tablet (15 mg total) by mouth daily. Take with food. 15 tablet 0   No current facility-administered medications on file prior to visit.    Past Surgical History  Procedure Laterality Date  . Appendectomy  20  . Patellar tendon surgery      bilateral  . Eye surgery      No Known Allergies  History   Social History  . Marital Status: Married    Spouse Name: N/A  . Number of Children: N/A  . Years of Education: N/A   Occupational History  . Not on file.   Social History Main Topics  . Smoking status: Never Smoker   . Smokeless tobacco: Never Used  . Alcohol Use: No  . Drug Use: No  . Sexual Activity: Not on file     Comment: owns barber shop, and does leaf removal, married,daughter, wants to exercise.   Other Topics Concern  . Not on file   Social History Narrative    Family History  Problem Relation Age  of Onset  . Cancer Father [id number]    pancreatic  . Hypertension Father   . Hypertension Brother   . Diabetes      cousin/uncle/ERSD    BP 143/80 mmHg  Pulse 57  Ht 5\' 11"  (1.803 m)  Wt 295 lb (133.811 kg)  BMI 41.16 kg/m2  Review of Systems: See HPI above.    Objective:  Physical Exam:  Gen: NAD  Left elbow: Apparent early popeye deformity compared to right.  No bruising, other swelling. TTP distal biceps.  No other elbow, upper arm tenderness. Unable to hook the biceps tendon at insertion compared to right elbow. 4/5 strength with elbow flexion, 5/5 with supination. NVI distally. Collateral ligaments intact.  MSK u/s:  Left distal biceps tendon appears ruptured with small hematoma distal to this, some retraction as well.    Assessment & Plan:  1. Left distal biceps tendon rupture - will go ahead with MRI to confirm and advised patient this will need surgical repair if present.  Sling, mobic and icing in meantime.  Addendum:  MRI reviewed and discussed with patient.  Confirms a complete distal biceps tendon rupture.  Will go ahead with orthopedic surgery referral.

## 2014-11-14 ENCOUNTER — Ambulatory Visit: Payer: Self-pay | Admitting: Family Medicine

## 2014-12-21 ENCOUNTER — Encounter: Payer: Self-pay | Admitting: Family Medicine

## 2014-12-21 ENCOUNTER — Ambulatory Visit (INDEPENDENT_AMBULATORY_CARE_PROVIDER_SITE_OTHER): Payer: BLUE CROSS/BLUE SHIELD | Admitting: Family Medicine

## 2014-12-21 VITALS — BP 147/92 | HR 59 | Ht 71.0 in | Wt 300.0 lb

## 2014-12-21 DIAGNOSIS — R7301 Impaired fasting glucose: Secondary | ICD-10-CM

## 2014-12-21 DIAGNOSIS — E785 Hyperlipidemia, unspecified: Secondary | ICD-10-CM

## 2014-12-21 DIAGNOSIS — I10 Essential (primary) hypertension: Secondary | ICD-10-CM | POA: Diagnosis not present

## 2014-12-21 LAB — COMPLETE METABOLIC PANEL WITH GFR
ALBUMIN: 4.6 g/dL (ref 3.5–5.2)
ALT: 23 U/L (ref 0–53)
AST: 20 U/L (ref 0–37)
Alkaline Phosphatase: 63 U/L (ref 39–117)
BUN: 11 mg/dL (ref 6–23)
CHLORIDE: 102 meq/L (ref 96–112)
CO2: 28 mEq/L (ref 19–32)
Calcium: 9.1 mg/dL (ref 8.4–10.5)
Creat: 0.82 mg/dL (ref 0.50–1.35)
GFR, Est African American: >89 mL/min
Glucose, Bld: 97 mg/dL (ref 70–99)
POTASSIUM: 4 meq/L (ref 3.5–5.3)
SODIUM: 137 meq/L (ref 135–145)
TOTAL PROTEIN: 7.1 g/dL (ref 6.0–8.3)
Total Bilirubin: 0.9 mg/dL (ref 0.2–1.2)

## 2014-12-21 LAB — LIPID PANEL
Cholesterol: 160 mg/dL (ref 0–200)
HDL: 34 mg/dL — ABNORMAL LOW (ref >=40)
LDL CALC: 106 mg/dL — AB (ref 0–99)
TRIGLYCERIDES: 102 mg/dL (ref <150)
Total CHOL/HDL Ratio: 4.7 Ratio
VLDL: 20 mg/dL (ref 0–40)

## 2014-12-21 LAB — POCT GLYCOSYLATED HEMOGLOBIN (HGB A1C): Hemoglobin A1C: 6.3

## 2014-12-21 MED ORDER — LOSARTAN POTASSIUM 100 MG PO TABS: 100.0000 mg | ORAL_TABLET | Freq: Every day | ORAL | Status: DC

## 2014-12-21 MED ORDER — ATORVASTATIN CALCIUM 80 MG PO TABS: 80.0000 mg | ORAL_TABLET | Freq: Every day | ORAL | Status: DC

## 2014-12-21 MED ORDER — AMLODIPINE BESYLATE 10 MG PO TABS: 10.0000 mg | ORAL_TABLET | Freq: Every day | ORAL | Status: DC

## 2014-12-21 NOTE — Patient Instructions (Signed)
DASH Eating Plan °DASH stands for "Dietary Approaches to Stop Hypertension." The DASH eating plan is a healthy eating plan that has been shown to reduce high blood pressure (hypertension). Additional health benefits may include reducing the risk of type 2 diabetes mellitus, heart disease, and stroke. The DASH eating plan may also help with weight loss. °WHAT DO I NEED TO KNOW ABOUT THE DASH EATING PLAN? °For the DASH eating plan, you will follow these general guidelines: °· Choose foods with a percent daily value for sodium of less than 5% (as listed on the food label). °· Use salt-free seasonings or herbs instead of table salt or sea salt. °· Check with your health care provider or pharmacist before using salt substitutes. °· Eat lower-sodium products, often labeled as "lower sodium" or "no salt added." °· Eat fresh foods. °· Eat more vegetables, fruits, and low-fat dairy products. °· Choose whole grains. Look for the word "whole" as the first word in the ingredient list. °· Choose fish and skinless chicken or turkey more often than red meat. Limit fish, poultry, and meat to 6 oz (170 g) each day. °· Limit sweets, desserts, sugars, and sugary drinks. °· Choose heart-healthy fats. °· Limit cheese to 1 oz (28 g) per day. °· Eat more home-cooked food and less restaurant, buffet, and fast food. °· Limit fried foods. °· Cook foods using methods other than frying. °· Limit canned vegetables. If you do use them, rinse them well to decrease the sodium. °· When eating at a restaurant, ask that your food be prepared with less salt, or no salt if possible. °WHAT FOODS CAN I EAT? °Seek help from a dietitian for individual calorie needs. °Grains °Whole grain or whole wheat bread. Brown rice. Whole grain or whole wheat pasta. Quinoa, bulgur, and whole grain cereals. Low-sodium cereals. Corn or whole wheat flour tortillas. Whole grain cornbread. Whole grain crackers. Low-sodium crackers. °Vegetables °Fresh or frozen vegetables  (raw, steamed, roasted, or grilled). Low-sodium or reduced-sodium tomato and vegetable juices. Low-sodium or reduced-sodium tomato sauce and paste. Low-sodium or reduced-sodium canned vegetables.  °Fruits °All fresh, canned (in natural juice), or frozen fruits. °Meat and Other Protein Products °Ground beef (85% or leaner), grass-fed beef, or beef trimmed of fat. Skinless chicken or turkey. Ground chicken or turkey. Pork trimmed of fat. All fish and seafood. Eggs. Dried beans, peas, or lentils. Unsalted nuts and seeds. Unsalted canned beans. °Dairy °Low-fat dairy products, such as skim or 1% milk, 2% or reduced-fat cheeses, low-fat ricotta or cottage cheese, or plain low-fat yogurt. Low-sodium or reduced-sodium cheeses. °Fats and Oils °Tub margarines without trans fats. Light or reduced-fat mayonnaise and salad dressings (reduced sodium). Avocado. Safflower, olive, or canola oils. Natural peanut or almond butter. °Other °Unsalted popcorn and pretzels. °The items listed above may not be a complete list of recommended foods or beverages. Contact your dietitian for more options. °WHAT FOODS ARE NOT RECOMMENDED? °Grains °White bread. White pasta. White rice. Refined cornbread. Bagels and croissants. Crackers that contain trans fat. °Vegetables °Creamed or fried vegetables. Vegetables in a cheese sauce. Regular canned vegetables. Regular canned tomato sauce and paste. Regular tomato and vegetable juices. °Fruits °Dried fruits. Canned fruit in light or heavy syrup. Fruit juice. °Meat and Other Protein Products °Fatty cuts of meat. Ribs, chicken wings, bacon, sausage, bologna, salami, chitterlings, fatback, hot dogs, bratwurst, and packaged luncheon meats. Salted nuts and seeds. Canned beans with salt. °Dairy °Whole or 2% milk, cream, half-and-half, and cream cheese. Whole-fat or sweetened yogurt. Full-fat   cheeses or blue cheese. Nondairy creamers and whipped toppings. Processed cheese, cheese spreads, or cheese  curds. °Condiments °Onion and garlic salt, seasoned salt, table salt, and sea salt. Canned and packaged gravies. Worcestershire sauce. Tartar sauce. Barbecue sauce. Teriyaki sauce. Soy sauce, including reduced sodium. Steak sauce. Fish sauce. Oyster sauce. Cocktail sauce. Horseradish. Ketchup and mustard. Meat flavorings and tenderizers. Bouillon cubes. Hot sauce. Tabasco sauce. Marinades. Taco seasonings. Relishes. °Fats and Oils °Butter, stick margarine, lard, shortening, ghee, and bacon fat. Coconut, palm kernel, or palm oils. Regular salad dressings. °Other °Pickles and olives. Salted popcorn and pretzels. °The items listed above may not be a complete list of foods and beverages to avoid. Contact your dietitian for more information. °WHERE CAN I FIND MORE INFORMATION? °National Heart, Lung, and Blood Institute: www.nhlbi.nih.gov/health/health-topics/topics/dash/ °Document Released: 08/15/2011 Document Revised: 01/10/2014 Document Reviewed: 06/30/2013 °ExitCare® Patient Information ©2015 [employer], LLC. This information is not intended to replace advice given to you by your health care provider. Make sure you discuss any questions you have with your health care provider. ° °

## 2014-12-21 NOTE — Progress Notes (Signed)
   Subjective:    Patient ID: Donald Olson, male    DOB: Oct 29, 1965, 49 y.o.   MRN: [id number]  HPI Hypertension- Pt denies chest pain, SOB, dizziness, or heart palpitations.  Taking meds as directed w/o problems.  Denies medication side effects.  On Coozar and amlodipine.   IFG - no inc thirst or urination.   He has had surgery less since I last saw him. He had a left bicep tendon rupture. Smoker but he just had his cast removed yesterday and is in a arm brace today. He will have to wear this for the next month. This occurred after he lifted a pullout sofa.  Review of Systems     Objective:   Physical Exam  Constitutional: He is oriented to person, place, and time. He appears well-developed and well-nourished.  HENT:  Head: Normocephalic and atraumatic.  Cardiovascular: Normal rate, regular rhythm and normal heart sounds.   Pulmonary/Chest: Effort normal and breath sounds normal.  Neurological: He is alert and oriented to person, place, and time.  Skin: Skin is warm and dry.  Psychiatric: He has a normal mood and affect. His behavior is normal.          Assessment & Plan:  HTN - not well controlled today. We discussed adding Hydrocort Dyazide to his regimen. He says he really wants to work on diet and exercise and has made some strides in that direction. I will see him back in 2 months to see if we can make some progress. I really like to see him lose about 30-40 pounds. If he could lose about 5 between now and his follow-up that would be great. If blood pressure still elevated at that time then consider starting hydrochlorothiazide.  IFG - due for CMP and lipids. Well controlled.

## 2014-12-26 ENCOUNTER — Emergency Department (INDEPENDENT_AMBULATORY_CARE_PROVIDER_SITE_OTHER): Payer: BLUE CROSS/BLUE SHIELD

## 2014-12-26 ENCOUNTER — Emergency Department
Admission: EM | Admit: 2014-12-26 | Discharge: 2014-12-26 | Disposition: A | Discharge status: Home / Self Care | Payer: BLUE CROSS/BLUE SHIELD | Source: Home / Self Care | Attending: Family Medicine | Admitting: Family Medicine

## 2014-12-26 ENCOUNTER — Encounter: Payer: Self-pay | Admitting: Emergency Medicine

## 2014-12-26 DIAGNOSIS — R590 Localized enlarged lymph nodes: Secondary | ICD-10-CM

## 2014-12-26 DIAGNOSIS — R1031 Right lower quadrant pain: Secondary | ICD-10-CM | POA: Diagnosis not present

## 2014-12-26 LAB — POCT URINALYSIS DIP (MANUAL ENTRY)
BILIRUBIN UA: NEGATIVE
Glucose, UA: NEGATIVE
Ketones, POC UA: NEGATIVE
Leukocytes, UA: NEGATIVE
Nitrite, UA: NEGATIVE
RBC UA: NEGATIVE
Spec Grav, UA: 1.015 (ref 1.005–1.03)
Urobilinogen, UA: 1 (ref 0–1)
pH, UA: 7 (ref 5–8)

## 2014-12-26 NOTE — ED Notes (Signed)
Reports intermittent pain in right groin and across pelvis for weeks; some frequency but no dysuria.

## 2014-12-26 NOTE — ED Provider Notes (Signed)
CSN: [id number]     Arrival date & time 12/26/14  1333 History   First MD Initiated Contact with Patient 12/26/14 1359     Chief Complaint  Patient presents with  . Groin Pain      HPI Comments: Patient complains of several week history of intermittent pain in his right groin and lower abdomen, worse when he steps down forcefully with his right leg.  No GI or GU symptoms.  No fevers, chills, and sweats.  He feels well otherwise.  He recalls no injury.  Patient is a 49 y.o. male presenting with groin pain. The history is provided by the patient.  Groin Pain This is a new problem. The current episode started more than 1 week ago. The problem occurs daily. The problem has been gradually worsening. Associated symptoms include abdominal pain. Exacerbated by: stepping forcefully on his right leg. The symptoms are relieved by NSAIDs. Treatments tried: Aleve. The treatment provided moderate relief.    Past Medical History  Diagnosis Date  . Hypertension   . Obesity   . Hyperlipidemia    Past Surgical History  Procedure Laterality Date  . Appendectomy  20  . Patellar tendon surgery      bilateral  . Eye surgery     Family History  Problem Relation Age of Onset  . Cancer Father 3058    pancreatic  . Hypertension Father   . Hypertension Brother   . Diabetes      cousin/uncle/ERSD   History  Substance Use Topics  . Smoking status: Never Smoker   . Smokeless tobacco: Never Used  . Alcohol Use: No    Review of Systems  Gastrointestinal: Positive for abdominal pain.  All other systems reviewed and are negative.   Allergies  Review of patient's allergies indicates not on file.  Home Medications   Prior to Admission medications   Medication Sig Start Date End Date Taking? Authorizing Provider  amLODipine (NORVASC) 10 MG tablet Take 1 tablet (10 mg total) by mouth daily. 12/21/14   Agapito Gamesatherine D Metheney, MD  atorvastatin (LIPITOR) 80 MG tablet Take 1 tablet (80 mg total) by mouth  daily. 12/21/14   Agapito Gamesatherine D Metheney, MD  doxycycline (VIBRAMYCIN) 100 MG capsule Take 1 capsule (100 mg total) by mouth 2 (two) times daily. Take with food. 01/02/15   Lattie HawStephen A Beese, MD  losartan (COZAAR) 100 MG tablet Take 1 tablet (100 mg total) by mouth daily. 12/21/14 12/21/15  Agapito Gamesatherine D Metheney, MD   BP 145/79 mmHg  Pulse 61  Temp(Src) 97.8 F (36.6 C) (Oral)  Resp 16  SpO2 98% Physical Exam  Constitutional: He appears well-developed and well-nourished. No distress.  Patient is obese.  HENT:  Head: Normocephalic.  Eyes: Pupils are equal, round, and reactive to light.  Cardiovascular: Normal heart sounds.   Pulmonary/Chest: Breath sounds normal.  Abdominal: Bowel sounds are normal. He exhibits no distension and no mass. There is tenderness in the suprapubic area. There is no guarding.  Genitourinary: Testes normal and penis normal.     There is tenderness to deep palpation right inguinal area during valsalva as noted on diagram.   Genitourinary:  Penis normal without lesions or urethral discharge.  Scrotum is normal.  Testes are descended bilaterally without nodules or tenderness. No regional lymphadenopathy palpated     Neurological: He is alert.  Skin: Skin is warm and dry. No rash noted.  Nursing note and vitals reviewed.   ED Course  Procedures  None  Labs Reviewed  POCT URINALYSIS DIP (MANUAL ENTRY):  PRO trace, otherwise negative.    Imaging Review US Pelvis Limited (Final result) Result time: 12/26/14 15:29:28   Final result by Rad Results In Interface (12/26/14 15:29:28)   Narrative:   CLINICAL DATA: Acute lower abdominal and groin pain.  EXAM: LIMITED ULTRASOUND OF PELVIS  TECHNIQUE: Limited transabdominal ultrasound examination of the pelvis was performed.  COMPARISON: None.  FINDINGS: 3.0 x 2.1 x 0.6 cm lymph node is noted in right suprapubic region consistent with benign lymph node. 3.5 x 0.7 x 1.6 cm lymph node is noted in left  suprapubic region. No other abnormality is noted in the visualized portion of the lower anterior pelvic wall.  IMPRESSION: Benign bilateral lymph nodes are noted in the groin regions. No significant abnormality seen.   Electronically Signed By: Lupita Raider, M.D. On: 12/26/2014 15:29      MDM   1. Groin pain, right; ?direct inguinal hernia    Will schedule CT Abdomen/Pelvis    Lattie Haw, MD 01/05/15 1359

## 2014-12-27 ENCOUNTER — Telehealth: Payer: Self-pay | Admitting: *Deleted

## 2015-01-02 ENCOUNTER — Ambulatory Visit (INDEPENDENT_AMBULATORY_CARE_PROVIDER_SITE_OTHER): Payer: BLUE CROSS/BLUE SHIELD

## 2015-01-02 ENCOUNTER — Emergency Department
Admission: EM | Admit: 2015-01-02 | Discharge: 2015-01-02 | Disposition: A | Discharge status: Home / Self Care | Payer: BLUE CROSS/BLUE SHIELD | Source: Home / Self Care | Attending: Family Medicine | Admitting: Family Medicine

## 2015-01-02 ENCOUNTER — Encounter: Payer: Self-pay | Admitting: Emergency Medicine

## 2015-01-02 DIAGNOSIS — L03314 Cellulitis of groin: Secondary | ICD-10-CM | POA: Diagnosis not present

## 2015-01-02 DIAGNOSIS — R590 Localized enlarged lymph nodes: Secondary | ICD-10-CM | POA: Diagnosis not present

## 2015-01-02 DIAGNOSIS — R2242 Localized swelling, mass and lump, left lower limb: Secondary | ICD-10-CM | POA: Diagnosis not present

## 2015-01-02 DIAGNOSIS — M7989 Other specified soft tissue disorders: Secondary | ICD-10-CM | POA: Diagnosis not present

## 2015-01-02 LAB — HIV ANTIBODY (ROUTINE TESTING W REFLEX): HIV 1&2 Ab, 4th Generation: NONREACTIVE

## 2015-01-02 LAB — RPR

## 2015-01-02 MED ORDER — IOHEXOL 300 MG/ML  SOLN
100.0000 mL | Freq: Once | INTRAMUSCULAR | Status: AC | PRN
  Administered 2015-01-02: 100 mL via INTRAVENOUS

## 2015-01-02 MED ORDER — DOXYCYCLINE HYCLATE 100 MG PO CAPS: 100.0000 mg | ORAL_CAPSULE | Freq: Two times a day (BID) | ORAL | Status: DC

## 2015-01-02 NOTE — Discharge Instructions (Signed)
Apply warm compress 2 to 3 times daily.  Return for I & D if increased swelling/pain occur.   Cellulitis Cellulitis is an infection of the skin and the tissue beneath it. The infected area is usually red and tender. Cellulitis occurs most often in the arms and lower legs.  CAUSES  Cellulitis is caused by bacteria that enter the skin through cracks or cuts in the skin. The most common types of bacteria that cause cellulitis are staphylococci and streptococci. SIGNS AND SYMPTOMS   Redness and warmth.  Swelling.  Tenderness or pain.  Fever. DIAGNOSIS  Your health care provider can usually determine what is wrong based on a physical exam. Blood tests may also be done. TREATMENT  Treatment usually involves taking an antibiotic medicine. HOME CARE INSTRUCTIONS   Take your antibiotic medicine as directed by your health care provider. Finish the antibiotic even if you start to feel better.  Keep the infected arm or leg elevated to reduce swelling.  Apply a warm cloth to the affected area up to 4 times per day to relieve pain.  Take medicines only as directed by your health care provider.  Keep all follow-up visits as directed by your health care provider. SEEK MEDICAL CARE IF:   You notice red streaks coming from the infected area.  Your red area gets larger or turns dark in color.  Your bone or joint underneath the infected area becomes painful after the skin has healed.  Your infection returns in the same area or another area.  You notice a swollen bump in the infected area.  You develop new symptoms.  You have a fever. SEEK IMMEDIATE MEDICAL CARE IF:   You feel very sleepy.  You develop vomiting or diarrhea.  You have a general ill feeling (malaise) with muscle aches and pains. MAKE SURE YOU:   Understand these instructions.  Will watch your condition.  Will get help right away if you are not doing well or get worse. Document Released: 06/05/2005 Document  Revised: 01/10/2014 Document Reviewed: 11/11/2011 [facility] Patient Information 2015 [place]ExitCare, [place]LL[place]. This information is not intended to replace advice given to you by your health care provider. Make sure you discuss any questions you have with your health care provider.

## 2015-01-02 NOTE — ED Notes (Signed)
Patient requests STD testing as he is recently single, he is asymptomatic and denies new sexual partners.

## 2015-01-02 NOTE — ED Provider Notes (Signed)
CSN: [id number]     Arrival date & time 01/02/15  [id number] History   First MD Initiated Contact with Patient 01/02/15 1022     Chief Complaint  Patient presents with  . Exposure to STD     HPI Comments: Patient returns for follow-up reporting that his right lower abdominal and groin discomfort has improved, but he has developed superficial tenderness in his left groin. He requests STD testing, although he assymptomatic at present and denies new sexual partners.  Discussed abnormal MRI findings of a left proximal thigh mass.  Patient reports that this the result of a thigh muscle tear that occurred 27 years ago.  The history is provided by the patient.    Past Medical History  Diagnosis Date  . Hypertension   . Obesity   . Hyperlipidemia    Past Surgical History  Procedure Laterality Date  . Appendectomy  20  . Patellar tendon surgery      bilateral  . Eye surgery     Family History  Problem Relation Age of Onset  . Cancer Father 2258    pancreatic  . Hypertension Father   . Hypertension Brother   . Diabetes      cousin/uncle/ERSD   History  Substance Use Topics  . Smoking status: Never Smoker   . Smokeless tobacco: Never Used  . Alcohol Use: No    Review of Systems  Constitutional: Negative for fever, chills, activity change, fatigue and unexpected weight change.  HENT: Negative.   Eyes: Negative.   Respiratory: Negative.   Cardiovascular: Negative.   Gastrointestinal: Negative.   Endocrine: Negative.   Genitourinary: Negative for dysuria, urgency, frequency, hematuria, flank pain, discharge, penile swelling, scrotal swelling, genital sores, penile pain and testicular pain.  Musculoskeletal: Negative.   Skin:       Tenderness in left groin  Neurological: Negative for headaches.    Allergies  Review of patient's allergies indicates not on file.  Home Medications   Prior to Admission medications   Medication Sig Start Date End Date Taking? Authorizing Provider   amLODipine (NORVASC) 10 MG tablet Take 1 tablet (10 mg total) by mouth daily. 12/21/14   Agapito Gamesatherine D Metheney, MD  atorvastatin (LIPITOR) 80 MG tablet Take 1 tablet (80 mg total) by mouth daily. 12/21/14   Agapito Gamesatherine D Metheney, MD  doxycycline (VIBRAMYCIN) 100 MG capsule Take 1 capsule (100 mg total) by mouth 2 (two) times daily. Take with food. 01/02/15   Lattie HawStephen A Harlon Kutner, MD  losartan (COZAAR) 100 MG tablet Take 1 tablet (100 mg total) by mouth daily. 12/21/14 12/21/15  Agapito Gamesatherine D Metheney, MD   BP 151/82 mmHg  Pulse 78  Temp(Src) 98.1 F (36.7 C) (Oral)  Ht 5\' 10"  (1.778 m)  Wt 283 lb (128.368 kg)  BMI 40.61 kg/m2  SpO2 98% Physical Exam Nursing notes and Vital Signs reviewed. Appearance:  Patient appears stated age, and in no acute distress.  Patient is obese (BMI 40.8) Eyes:  Pupils are equal, round, and reactive to light and accomodation.  Extraocular movement is intact.  Conjunctivae are not inflamed   Pharynx:  Normal Neck:  Supple.  No adenopathy Lungs:  Clear to auscultation.  Breath sounds are equal.  Heart:  Regular rate and rhythm without murmurs, rubs, or gallops.  Abdomen:  Nontender without masses or hepatosplenomegaly.  Bowel sounds are present.  No CVA or flank tenderness.  Extremities:  No edema.  No calf tenderness Skin:  No rash present.  Genitourinary:  Penis  normal without lesions or urethral discharge.  Scrotum is normal.  Testes are descended bilaterally without nodules or tenderness.  There is superficial tenderness to palpation and induration in the left inguinal crease.  No fluctuance. Superficial inguinal nodes tender to palpation  ED Course  Procedures  None    Labs Reviewed  HSV(HERPES SMPLX)ABS-I+II(IGG+IGM)-BLD  RPR  HIV ANTIBODY (ROUTINE TESTING)  GC/CHLAMYDIA PROBE AMP, URINE    Imaging Review Ct Abdomen Pelvis W Contrast  01/02/2015   CLINICAL DATA:  RIGHT groin pain. RIGHT lower quadrant pain. Initial encounter.  EXAM: CT ABDOMEN AND PELVIS WITH  CONTRAST  TECHNIQUE: Multidetector CT imaging of the abdomen and pelvis was performed using the standard protocol following bolus administration of intravenous contrast.  CONTRAST:  OMNIPAQUE IOHEXOL 300 MG/ML  SOLN  COMPARISON:  Ultrasound 12/26/2014.  LEFT hip radiographs 10/06/2012  FINDINGS: Musculoskeletal: Partially visualized large mass in the proximal LEFT thigh. This is interposed between the proximal rectus femoris and the tensor fascia lata. Although incompletely visualized, this measures 7.8 cm transverse by 6.5 cm AP. Small calcifications are present within the mass. If this is a new diagnosis, further MR imaging of the proximal LEFT thigh is recommended with and without IV gadolinium contrast. The coronal images suggest this is within the indirect head of the rectus femoris  Lung Bases: Atelectasis.  Liver:  Normal.  Spleen:  Normal.  Gallbladder:  Contracted.  Common bile duct:  Normal.  Pancreas:  Normal.  Adrenal glands:  Normal bilaterally.  Kidneys: Renal enhancement is within normal limits. Probable tiny cyst in the interpolar Lower polar LEFT kidney.  Stomach:  Normal.  Small bowel:  Normal.  No inflammatory changes or bowel obstruction.  Colon: Appendectomy. No inflammatory changes or obstruction of colon.  Pelvic Genitourinary:  Normal.  Peritoneum: No free air or free fluid.  No mesenteric adenopathy.  Vasculature: Mild atherosclerosis.  Body Wall: Fat containing periumbilical hernia. Mild stranding is present in the LEFT inguinal region, which is a nonspecific finding. This extends to the inguinal crease. No inguinal hernia in this patient with RIGHT groin pain. RIGHT inguinal canal appears normal. No RIGHT inguinal or external iliac adenopathy.  Borderline LEFT 1 cm external iliac lymph nodes incidentally noted  IMPRESSION: 1. Partially visible 8 cm LEFT proximal thigh mass with calcifications. MRI with and without contrast would be the next best test for further characterization. In  this location, the primary considerations are abscess, lymphadenopathy, hematoma/myositis ossificans or soft tissue sarcoma. 2. No acute abdominal abnormality. 3. Borderline LEFT external iliac lymph nodes are nonspecific. These may be reactive or neoplastic. 4. Soft tissue stranding and infiltration in the LEFT inguinal crease is also nonspecific and most commonly associated with cutaneous infection.   Electronically Signed   By: Andreas Newport M.D.   On: 01/02/2015 09:54     MDM   1. Cellulitis of left groin    Begin doxycycline  BID  Apply warm compress 2 to 3 times daily.  Return for I & D if increased swelling/pain occur. Screening HSV, RPR, HIV, GC/chlamydia     Lattie Haw, MD 01/06/15 [phone]

## 2015-01-03 LAB — HSV(HERPES SMPLX)ABS-I+II(IGG+IGM)-BLD
HSV 1 GLYCOPROTEIN G AB, IGG: 9.79 IV — AB
HSV 2 Glycoprotein G Ab, IgG: 7.47 IV — ABNORMAL HIGH
Herpes Simplex Vrs I&II-IgM Ab (EIA): 0.21 INDEX

## 2015-01-03 LAB — GC/CHLAMYDIA PROBE AMP, URINE
Chlamydia, Swab/Urine, PCR: NEGATIVE
GC Probe Amp, Urine: NEGATIVE

## 2015-01-05 ENCOUNTER — Telehealth: Payer: Self-pay | Admitting: *Deleted

## 2015-01-05 NOTE — Discharge Instructions (Signed)

## 2015-01-06 ENCOUNTER — Ambulatory Visit (INDEPENDENT_AMBULATORY_CARE_PROVIDER_SITE_OTHER): Payer: BLUE CROSS/BLUE SHIELD | Admitting: Physical Therapy

## 2015-01-06 ENCOUNTER — Encounter: Payer: Self-pay | Admitting: Physical Therapy

## 2015-01-06 DIAGNOSIS — M25622 Stiffness of left elbow, not elsewhere classified: Secondary | ICD-10-CM

## 2015-01-06 DIAGNOSIS — R531 Weakness: Secondary | ICD-10-CM

## 2015-01-06 NOTE — Patient Instructions (Signed)
Pt educated in wrist therex for strengthening flex, ext, pronate, supinate.  PROM for elbow flexion. Stretching into supination and pronation

## 2015-01-06 NOTE — Therapy (Signed)
[facility] [address] [place], [place], [address] Phone: [phone]   Fax:  [phone]  Physical Therapy Evaluation  Patient Details  Name: Donald NunneryDerek Olson MRN: [id number] Date of Birth: 1965-12-15 Referring Provider:  Salvatore MarvelWainer, Robert, MD  Encounter Date: 01/06/2015      PT End of Session - 01/06/15 [id number]    Visit Number (p) 1   Number of Visits (p) 8   Date for PT Re-Evaluation (p) 02/17/15   PT Start Time (p) 0805   PT Stop Time (p) 0852   PT Time Calculation (min) (p) 47 min      Past Medical History  Diagnosis Date  . Hypertension   . Obesity   . Hyperlipidemia     Past Surgical History  Procedure Laterality Date  . Appendectomy  20  . Patellar tendon surgery      bilateral  . Eye surgery      There were no vitals filed for this visit.  Visit Diagnosis:  Weakness generalized  Decreased ROM of left elbow      Subjective Assessment - 01/06/15 0812    Subjective no pain. injured left elbow lifting a sleeper sofa. surgery 3 wees ago to repair ruptured bicep. pt states he feels weak. he is ready to ride his motorcycle again. pt is right handed, works as a Editor, commissioningbarber   Patient Stated Goals regain strength   Currently in Pain? No/denies            [facility] - 01/06/15 0001    Assessment   Medical Diagnosis pt s/p bicep tendon repair 4 weeks ago   Onset Date 12/06/14   Next MD Visit 2 weeks   Precautions   Precaution Comments per protocol   Restrictions   Other Position/Activity Restrictions per protocol   Balance Screen   Has the patient fallen in the past 6 months No   Prior Function   Level of Independence Independent with basic ADLs;Independent with gait   Vocation --  barber   Observation/Other Assessments   Skin Integrity decreased mm bulk Lt forearm   Focus on Therapeutic Outcomes (FOTO)  45%   Sensation   Light Touch Appears Intact   ROM / Strength   AROM / PROM /  Strength AROM;Strength   AROM   Overall AROM Comments --  shoulder ROM WFL Bilaterally   Right/Left Wrist Right   Right Wrist Extension 60 Degrees   Right Wrist Flexion 70 Degrees   Left Wrist Extension 55 Degrees   Left Wrist Flexion 65 Degrees   PROM   Overall PROM Comments --  elbow PROM WNL    Strength   Overall Strength Comments --  shoulder strength WFL Bilaterally   Right/Left Wrist Right   Right Wrist Flexion 5/5   Right Wrist Extension 5/5   Right Wrist Radial Deviation 5/5   Right Wrist Ulnar Deviation 5/5   Left Wrist Flexion 5/5   Left Wrist Extension 4/5   Left Wrist Radial Deviation 4+/5   Left Wrist Ulnar Deviation 4+/5   Right/Left hand --  right grip 150#, left grip 100#   Palpation   Palpation --  no tenderness to palpation                   [facility] Adult PT Treatment/Exercise - 01/06/15 0001    Elbow Exercises   Elbow Flexion Limitations --  PROM 2 x 30 sec   Forearm Supination AROM;Left;20 reps  3#  Forearm Pronation AROM;Left;20 reps  3#   Wrist Flexion Right;20 reps  3#   Wrist Extension Right;20 reps  3#   Wrist Exercises   Wrist Radial Deviation Right;20 reps  3#   Wrist Ulnar Deviation Right;20 reps  3#   Other wrist exercises stretching wrist flex, ext, pronate, supinate 2 x 30 sec                PT Education - 01/06/15 0850    Education provided Yes   Education Details HEP   Person(s) Educated Patient   Methods Explanation;Demonstration;Handout   Comprehension Returned demonstration;Verbalized understanding             PT Long Term Goals - 01/06/15 0924    PT LONG TERM GOAL #1   Title Pt will improve Lt wrist ROM to equal Rt wrist ROM to improve functional abilities   Time 6   Period Weeks   Status New   PT LONG TERM GOAL #2   Title Pt will improve Lt wrist strength to 5/5 to work without fatigue   Time 6   Period Weeks   Status New   PT LONG TERM GOAL #3   Title Pt will improve FOTO to 30% to  demo improved functional abilities   Time 6   Period Weeks   Status New               Plan - 01/06/15 1610    Clinical Impression Statement Pt presents with decreased wrist strength and ROM, decreased bicep strength due to surgery. pt will benefit from skilled PT to improve strength and ROM   Pt will benefit from skilled therapeutic intervention in order to improve on the following deficits Decreased strength;Impaired flexibility;Impaired UE functional use   Rehab Potential Excellent   PT Frequency 2x / week   PT Duration 6 weeks   PT Treatment/Interventions Cryotherapy;Electrical Stimulation;Manual techniques;Therapeutic exercise;Moist Heat;Therapeutic activities;Patient/family education;Scar mobilization;Passive range of motion   PT Next Visit Plan progress per protocol   PT Home Exercise Plan given to pt   Consulted and Agree with Plan of Care Patient         Problem List Patient Active Problem List   Diagnosis Date Noted  . Gout 11/08/2014  . Rupture of distal biceps tendon 11/08/2014  . Left hip pain 10/06/2012  . IFG (impaired fasting glucose) 05/12/2012  . OBESITY, CLASS II 11/12/2010  . EXTERNAL HEMORRHOIDS 03/26/2010  . Hyperlipidemia 09/19/2009  . ESSENTIAL HYPERTENSION, BENIGN 09/18/2009    Reggy Eye, PT, DPT  01/06/2015, 10:36 AM  [facility] [address] [place], [place], [address] Phone: [phone]   Fax:  [phone]

## 2015-01-09 ENCOUNTER — Ambulatory Visit: Payer: BLUE CROSS/BLUE SHIELD | Admitting: Physical Therapy

## 2015-01-23 ENCOUNTER — Ambulatory Visit (INDEPENDENT_AMBULATORY_CARE_PROVIDER_SITE_OTHER): Payer: BLUE CROSS/BLUE SHIELD | Admitting: Physical Therapy

## 2015-01-23 DIAGNOSIS — R531 Weakness: Secondary | ICD-10-CM

## 2015-01-23 DIAGNOSIS — M25622 Stiffness of left elbow, not elsewhere classified: Secondary | ICD-10-CM

## 2015-01-23 NOTE — Therapy (Signed)
[facility] [address] [place], [place], [address] Phone: [phone]   Fax:  [phone]  Physical Therapy Evaluation  Patient Details  Name: Donald Olson MRN: [id number] Date of Birth: [birth year] Referring Provider:  Salvatore MarvelWainer, Robert, MD  Encounter Date: 01/06/2015    Past Medical History  Diagnosis Date  . Hypertension   . Obesity   . Hyperlipidemia     Past Surgical History  Procedure Laterality Date  . Appendectomy  20  . Patellar tendon surgery      bilateral  . Eye surgery      There were no vitals filed for this visit.  Visit Diagnosis:  Weakness generalized  Decreased ROM of left elbow                                  PT Long Term Goals - 01/06/15 0924    PT LONG TERM GOAL #1   Title Pt will improve Lt wrist ROM to equal Rt wrist ROM to improve functional abilities   Time 6   Period Weeks   Status New   PT LONG TERM GOAL #2   Title Pt will improve Lt wrist strength to 5/5 to work without fatigue   Time 6   Period Weeks   Status New   PT LONG TERM GOAL #3   Title Pt will improve FOTO to 30% to demo improved functional abilities   Time 6   Period Weeks   Status New                Problem List Patient Active Problem List   Diagnosis Date Noted  . Gout 11/08/2014  . Rupture of distal biceps tendon 11/08/2014  . Left hip pain 10/06/2012  . IFG (impaired fasting glucose) 05/12/2012  . OBESITY, CLASS II 11/12/2010  . EXTERNAL HEMORRHOIDS 03/26/2010  . Hyperlipidemia 09/19/2009  . ESSENTIAL HYPERTENSION, BENIGN 09/18/2009    Roderic ScarceSusan Shaver, PT   01/23/2015, 8:30 AM Note created for [name] PT  [facility] [address] [place], [place], [address] Phone: [phone]   Fax:  [phone]

## 2015-01-23 NOTE — Patient Instructions (Signed)
Elbow Flexion: Resisted   With tubing wrapped around left fist and other end secured under foot, curl arm up as far as possible. Repeat __10__ times per set. Do _2-3___ sets per session. Do _1___ sessions per day.  Elbow Extension: Resisted   With tubing wrapped around left fist and other end anchored, straighten elbow. Repeat _10___ times per set. Do __2-3__ sets per session. Do __1__ sessions per day.  Strengthening: Resisted Extension   Hold tubing in right hand, arm forward. Pull arm back, elbow straight. Repeat __10__ times per set. Do _2-3___ sets per session. Do _1___ sessions per day.  Strengthening: Resisted Flexion   Hold tubing with left arm at side. Pull forward and up. Move shoulder through pain-free range of motion. Repeat _10___ times per set. Do __2-3__ sets per session. Do ____ sessions per day. Wrist Extension: Resisted   With tubing wrapped around left fist and other end secured under foot, bend wrist up (palm down) as far as possible. Keep forearm on thigh. Repeat _10___ times per set. Do __2__ sets per session. Do _1___ sessions per day.  Wrist Flexion: Resisted   With tubing wrapped around left fist and other end secured under foot, bend wrist up (palm up) as far as possible. Keep forearm on thigh. Repeat _10___ times per set. Do __2__ sets per session. Do __1__ sessions per day.   [facility] [address] [place], [place] [address]  [phone] (office) [phone] (fax)   Copyright  VHI. All rights reserved.

## 2015-01-23 NOTE — Addendum Note (Signed)
Addended by: SHAVER, SUSAN E on: 01/23/2015 03:14 PM   Modules accepted: Orders

## 2015-01-23 NOTE — Therapy (Addendum)
[place] [facility] [place], [place], [address] Phone: [phone]   Fax:  [phone]  Physical Therapy Treatment  Patient Details  Name: Donald Olson MRN: [id number] Date of Birth: 1965/09/26 Referring Provider:  Elsie Saas, MD  Encounter Date: 01/23/2015      PT End of Session - 01/23/15 0846    Visit Number 2   Number of Visits 8   Date for PT Re-Evaluation 02/17/15   PT Start Time 0845   PT Stop Time 0930   PT Time Calculation (min) 45 min   Activity Tolerance No increased pain;Patient tolerated treatment well      Past Medical History  Diagnosis Date  . Hypertension   . Obesity   . Hyperlipidemia     Past Surgical History  Procedure Laterality Date  . Appendectomy  20  . Patellar tendon surgery      bilateral  . Eye surgery      There were no vitals filed for this visit.  Visit Diagnosis:  Weakness generalized  Decreased ROM of left elbow      Subjective Assessment - 01/23/15 0956    Subjective Pt reports no pain in Lt elbow.  Has been out of brace for at least a week, continues to limit any lifting with Lt UE. 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  Pt states is anxious to begin riding motorcycle; would like to be able to ride for [facility] day.    Currently in Pain? No/denies            [facility] PT Assessment - 01/23/15 0001    Assessment   Medical Diagnosis pt s/p bicep tendon repair 4 weeks ago   Onset Date 12/06/14   ROM / Strength   AROM / PROM / Strength AROM;Strength   AROM   AROM Assessment Site Forearm;Elbow;Wrist   Right/Left Elbow Left  sup 75, pron 90 deg   Left Elbow Flexion 128   Left Elbow Extension 0   Right/Left Forearm Left   Left Forearm Pronation 90 Degrees   Left Forearm Supination 75 Degrees   Right/Left Wrist Left   Left Wrist Extension 68 Degrees   Left Wrist Flexion 66 Degrees   Strength   Strength Assessment Site Elbow;Wrist;Forearm    Right/Left Elbow Left   Left Elbow Flexion 4+/5   Left Elbow Extension 5/5   Right/Left Forearm Left   Left Forearm Pronation 5/5   Left Forearm Supination 5/5   Right/Left Wrist Left   Right Wrist Flexion 5/5   Right Wrist Extension 5/5   Right Wrist Radial Deviation 5/5   Right Wrist Ulnar Deviation 5/5   Left Wrist Flexion 5/5   Left Wrist Extension 5/5   Left Wrist Radial Deviation 5/5   Left Wrist Ulnar Deviation 5/5   Right/Left hand Left;Right   Grip (lbs) 140   Grip (lbs) 118                     [facility] Adult PT Treatment/Exercise - 01/23/15 0001    Exercises   Exercises Shoulder;Wrist;Elbow   Elbow Exercises   Elbow Flexion Strengthening;Left;20 reps  palm up, thumb up (20 each)   Theraband Level (Elbow Flexion) Level 2 (Red)   Elbow Extension Strengthening;Left;20 reps   Theraband Level (Elbow Extension) Level 2 (Red);Level 3 (Green)  x 10 each   Forearm Supination Strengthening;Left;15 reps  2 sets, 4#    Forearm Pronation Strengthening;Left;15 reps;Bar weights/barbell  2 sets, 4#    Other elbow exercises Wrist extension and flexion (Lt) stretch x 30 sec x 2    Other elbow exercises Forearm pronation with extension stretch x 30 sec x 2   Shoulder Exercises: Standing   Flexion Strengthening;Left;15 reps   Theraband Level (Shoulder Flexion) Level 2 (Red)   ABduction Strengthening;Left;15 reps;Theraband   Theraband Level (Shoulder ABduction) Level 2 (Red)   Extension Strengthening;15 reps;Theraband   Theraband Level (Shoulder Extension) Level 2 (Red)   Shoulder Exercises: ROM/Strengthening   UBE (Upper Arm Bike) L1: 2 min forward and backward    Shoulder Exercises: Stretch   Other Shoulder Stretches wrist ext with hor abduction x 30 sec x 2 rep (L only)   Other Shoulder Stretches shoulder extension stretch bilaterally x 30 sec                      PT Long Term Goals - 01/23/15 1007    PT LONG TERM GOAL #1   Title Pt will improve Lt  wrist ROM to equal Rt wrist ROM to improve functional abilities   Time 6   Period Weeks   Status Achieved   PT LONG TERM GOAL #2   Title Pt will improve Lt wrist strength to 5/5 to work without fatigue   Time 6   Period Weeks   Status Achieved   PT LONG TERM GOAL #3   Title Pt will improve FOTO to 30% to demo improved functional abilities   Time 6   Period Weeks   Status On-going   PT LONG TERM GOAL #4   Title demo Lt elbow and shoulder ROM WNL to allow over head activity   Time 4   Period Weeks   Status New   PT LONG TERM GOAL #5   Title increase strength Lt shoulder and elbow =/> 5-/5 to allow return to prior level with activity   Time 4   Period Weeks   Status New   Additional Long Term Goals   Additional Long Term Goals Yes   PT LONG TERM GOAL #6   Title weight bear through his UE's without pain or difficulty to allow him to ride his motorcycle   Time 4   Period Weeks   Status New               Plan - 01/23/15 0943    Clinical Impression Statement Pt has moved into Phase III of bicep tendon repair rehab (surgeon protocol). Pt demonstrated improved Lt wrist strength,  ext ROM, and grip strength. Pt tolerated new exercises with light resistance for elbow flexion/ext, without any pain - just fatigue. Pt has met LTG #1.     Pt will benefit from skilled therapeutic intervention in order to improve on the following deficits Decreased strength;Impaired flexibility;Impaired UE functional use   Rehab Potential Excellent   PT Frequency 2x / week   PT Duration 6 weeks   PT Treatment/Interventions Cryotherapy;Electrical Stimulation;Manual techniques;Therapeutic exercise;Moist Heat;Therapeutic activities;Patient/family education;Scar mobilization;Passive range of motion   PT Next Visit Plan Assess response to HEP. Continue progressive strengthening to Lt elbow.    Consulted and Agree with Plan of Care Patient        Problem List Patient Active Problem List   Diagnosis  Date Noted  . Gout 11/08/2014  . Rupture of distal biceps tendon 11/08/2014  . Left hip pain 10/06/2012  . IFG (impaired fasting glucose) 05/12/2012  . OBESITY, CLASS II 11/12/2010  .  EXTERNAL HEMORRHOIDS 03/26/2010  . Hyperlipidemia 09/19/2009  . ESSENTIAL HYPERTENSION, BENIGN 09/18/2009    Kerin Perna, PTA 01/23/2015 3:12 PM  [place], [place], [address] Phone: [phone]   Fax:  [place], PT 01/23/2015 3:12 PM

## 2015-01-25 ENCOUNTER — Ambulatory Visit (INDEPENDENT_AMBULATORY_CARE_PROVIDER_SITE_OTHER): Payer: BLUE CROSS/BLUE SHIELD | Admitting: Physical Therapy

## 2015-01-25 ENCOUNTER — Encounter: Payer: Self-pay | Admitting: Physical Therapy

## 2015-01-25 DIAGNOSIS — R531 Weakness: Secondary | ICD-10-CM

## 2015-01-25 NOTE — Therapy (Signed)
[facility] [address] [place], [place], [address] Phone: [phone]   Fax:  [phone]  Physical Therapy Treatment  Patient Details  Name: Donald NunneryDerek Olson MRN: [id number] Date of Birth: 08-Aug-1966 Referring Provider:  Salvatore MarvelWainer, Robert, MD  Encounter Date: 01/25/2015      PT End of Session - 01/25/15 0741    Visit Number 3   Number of Visits 8   Date for PT Re-Evaluation 02/17/15   PT Start Time 0737   PT Stop Time 0812   PT Time Calculation (min) 35 min      Past Medical History  Diagnosis Date  . Hypertension   . Obesity   . Hyperlipidemia     Past Surgical History  Procedure Laterality Date  . Appendectomy  20  . Patellar tendon surgery      bilateral  . Eye surgery      There were no vitals filed for this visit.  Visit Diagnosis:  Weakness generalized      Subjective Assessment - 01/25/15 0742    Subjective Doing well   Currently in Pain? No/denies            [facility] - 01/25/15 0001    Assessment   Medical Diagnosis pt s/p bicep tendon repair 4 weeks ago   Onset Date 12/06/14   Next MD Visit 01/31/15                     [facility] Treatment/Exercise - 01/25/15 0001    Elbow Exercises   Elbow Flexion Left  30 reps 4#   Elbow Flexion Limitations --  then with wrist rotation   Elbow Extension Left  30 reps 4#   Forearm Supination Strengthening;Left  30 with 2#   Forearm Pronation Strengthening;Left  30 reps 2#   Other elbow exercises wrist flex/ext rolling dowel with 2.5# up/down, elbows at side and then reaching forward   Shoulder Exercises: Standing   Other Standing Exercises weight bearing through arms on wall walking up/down.    Other Standing Exercises wall push ups x 10   Shoulder Exercises: ROM/Strengthening   UBE (Upper Arm Bike) L2 x 2'/2'                     PT Long Term Goals - 01/23/15 1007    PT LONG TERM GOAL #1   Title  Pt will improve Lt wrist ROM to equal Rt wrist ROM to improve functional abilities   Time 6   Period Weeks   Status Achieved   PT LONG TERM GOAL #2   Title Pt will improve Lt wrist strength to 5/5 to work without fatigue   Time 6   Period Weeks   Status Achieved   PT LONG TERM GOAL #3   Title Pt will improve FOTO to 30% to demo improved functional abilities   Time 6   Period Weeks   Status On-going   PT LONG TERM GOAL #4   Title demo Lt elbow and shoulder ROM WNL to allow over head activity   Time 4   Period Weeks   Status New   PT LONG TERM GOAL #5   Title increase strength Lt shoulder and elbow =/> 5-/5 to allow return to prior level with activity   Time 4   Period Weeks   Status New   Additional Long Term Goals   Additional Long Term Goals Yes   PT LONG  TERM GOAL #6   Title weight bear through his UE's without pain or difficulty to allow him to ride his motorcycle   Time 4   Period Weeks   Status New               Plan - 01/25/15 [id number]    Clinical Impression Statement Pt is tolerating treatments well, no pain with increased resistance.     Pt will benefit from skilled therapeutic intervention in order to improve on the following deficits Decreased strength;Impaired flexibility;Impaired UE functional use   Rehab Potential Excellent   PT Frequency 2x / week   PT Duration 6 weeks   PT Treatment/Interventions Cryotherapy;Electrical Stimulation;Manual techniques;Therapeutic exercise;Moist Heat;Therapeutic activities;Patient/family education;Scar mobilization;Passive range of motion   PT Next Visit Plan cont to progress strengthening and add in more weightbearing through the UE's   Consulted and Agree with Plan of Care Patient        Problem List Patient Active Problem List   Diagnosis Date Noted  . Gout 11/08/2014  . Rupture of distal biceps tendon 11/08/2014  . Left hip pain 10/06/2012  . IFG (impaired fasting glucose) 05/12/2012  . OBESITY, CLASS II  11/12/2010  . EXTERNAL HEMORRHOIDS 03/26/2010  . Hyperlipidemia 09/19/2009  . ESSENTIAL HYPERTENSION, BENIGN 09/18/2009    Roderic ScarceSusan Kileigh Ortmann, PT 01/25/2015, 9:06 AM  [facility] [address] [place], [place], [address] Phone: [phone]   Fax:  [phone]

## 2015-01-25 NOTE — Patient Instructions (Signed)
Instructed in scar massage/mobilization

## 2015-01-30 ENCOUNTER — Ambulatory Visit (INDEPENDENT_AMBULATORY_CARE_PROVIDER_SITE_OTHER): Payer: BLUE CROSS/BLUE SHIELD | Admitting: Physical Therapy

## 2015-01-30 DIAGNOSIS — M25622 Stiffness of left elbow, not elsewhere classified: Secondary | ICD-10-CM | POA: Diagnosis not present

## 2015-01-30 DIAGNOSIS — R531 Weakness: Secondary | ICD-10-CM

## 2015-01-30 NOTE — Therapy (Signed)
[facility] [address] [place], [place], [address] Phone: [phone]   Fax:  [phone]  Physical Therapy Treatment  Patient Details  Name: Donald Olson MRN: [id number] Date of Birth: Dec 11, 1965 Referring Provider:  Salvatore Marvel, MD  Encounter Date: 01/30/2015      PT End of Session - 01/30/15 0805    Visit Number 4   Number of Visits 8   Date for PT Re-Evaluation 02/17/15   PT Start Time 0805   PT Stop Time 0845   PT Time Calculation (min) 40 min   Activity Tolerance Patient tolerated treatment well      Past Medical History  Diagnosis Date  . Hypertension   . Obesity   . Hyperlipidemia     Past Surgical History  Procedure Laterality Date  . Appendectomy  20  . Patellar tendon surgery      bilateral  . Eye surgery      There were no vitals filed for this visit.  Visit Diagnosis:  Weakness generalized  Decreased ROM of left elbow      Subjective Assessment - 01/30/15 0808    Subjective Pt reports soreness from lifting 2 x 6's for friend; no pain.    Currently in Pain? No/denies            [facility] PT Assessment - 01/30/15 0001    Assessment   Medical Diagnosis pt s/p bicep tendon repair 4 weeks ago   Onset Date/Surgical Date 12/06/14   Next MD Visit 01/31/15   AROM   AROM Assessment Site Forearm;Wrist;Elbow   Right/Left Elbow Left   Left Elbow Flexion 128   Left Elbow Extension 0   Left Forearm Pronation 87 Degrees   Left Forearm Supination 79 Degrees   Left Wrist Extension 79 Degrees   Left Wrist Flexion 70 Degrees   Strength   Strength Assessment Site Elbow   Left Elbow Flexion --  5-/5   Left Elbow Extension 5/5   Right/Left hand Right;Left   Right Hand Grip (lbs) 150   Left Hand Grip (lbs) 130                     [facility] Adult PT Treatment/Exercise - 01/30/15 0001    Exercises   Exercises Shoulder;Elbow   Elbow Exercises   Elbow Flexion  Left;Strengthening;10 reps;Seated;Bar weights/barbell  5# palm up x10, thumb up x 10, sup/pron x 10   Elbow Extension Strengthening;Left  4# x 10, 5# x 6    Theraband Level (Elbow Extension) Level 4 (Blue)   Bar Weights/Barbell (Elbow Extension) 4 lbs;5 lbs   Forearm Supination Strengthening;Left;20 reps;Seated;Bar weights/barbell  3#   Forearm Pronation Strengthening;Left;20 reps;Seated;Bar weights/barbell  4#   Wrist Flexion Strengthening;Left;20 reps;Seated;Bar weights/barbell  4#   Wrist Extension Strengthening;Left;20 reps;Seated;Bar weights/barbell  4#   Other elbow exercises wrist flex/ext rolling dowel with 2.5# up/down, elbows at side and then reaching forward x 6 reps    Other elbow exercises wrist flex and ext stretch x 30 sec (Lt ), forearm pronation with flexion stretch x 30 sec    Shoulder Exercises: Standing   Flexion Strengthening;Left;15 reps;Weights  5#, 2 sets of 10   Theraband Level (Shoulder Flexion) Level 4 (Blue)  x 8 reps to return demo for [employer] Strengthening;Left;20 reps;Theraband   Theraband Level (Shoulder Extension) Level 4 (Blue)   Other Standing Exercises Elbow extension stretch 15 sec hold 3 reps   Shoulder Exercises: ROM/Strengthening  UBE (Upper Arm Bike) L4 x 2'/2' standing    Wall Pushups 10 reps                     PT Long Term Goals - 01/23/15 1007    PT LONG TERM GOAL #1   Title Pt will improve Lt wrist ROM to equal Rt wrist ROM to improve functional abilities   Time 6   Period Weeks   Status Achieved   PT LONG TERM GOAL #2   Title Pt will improve Lt wrist strength to 5/5 to work without fatigue   Time 6   Period Weeks   Status Achieved   PT LONG TERM GOAL #3   Title Pt will improve FOTO to 30% to demo improved functional abilities   Time 6   Period Weeks   Status On-going   PT LONG TERM GOAL #4   Title demo Lt elbow and shoulder ROM WNL to allow over head activity   Time 4   Period Weeks   Status New    PT LONG TERM GOAL #5   Title increase strength Lt shoulder and elbow =/> 5-/5 to allow return to prior level with activity   Time 4   Period Weeks   Status New   Additional Long Term Goals   Additional Long Term Goals Yes   PT LONG TERM GOAL #6   Title weight bear through his UE's without pain or difficulty to allow him to ride his motorcycle   Time 4   Period Weeks   Status New               Plan - 01/30/15 1248    Clinical Impression Statement Pt demo improved Lt wrist extension and supination ROM, improved elbow flexion and grip strength.  Pt tolerated increased resistance this visit, LUE tremulous with second set of 10. Making great progress towards established goals.    Pt will benefit from skilled therapeutic intervention in order to improve on the following deficits Decreased strength;Impaired flexibility;Impaired UE functional use   Rehab Potential Excellent   PT Frequency 2x / week   PT Duration 6 weeks   PT Treatment/Interventions Cryotherapy;Electrical Stimulation;Manual techniques;Therapeutic exercise;Moist Heat;Therapeutic activities;Patient/family education;Scar mobilization;Passive range of motion   PT Next Visit Plan cont to progress strengthening and add in more weightbearing through the UE's. Reassess shoulder ROM/strength, wrist strength (LTG #2 +4)   Consulted and Agree with Plan of Care Patient        Problem List Patient Active Problem List   Diagnosis Date Noted  . Gout 11/08/2014  . Rupture of distal biceps tendon 11/08/2014  . Left hip pain 10/06/2012  . IFG (impaired fasting glucose) 05/12/2012  . OBESITY, CLASS II 11/12/2010  . EXTERNAL HEMORRHOIDS 03/26/2010  . Hyperlipidemia 09/19/2009  . ESSENTIAL HYPERTENSION, BENIGN 09/18/2009   Mayer CamelJennifer Carlson-Long, PTA 01/30/2015 2:20 PM  [facility] [address] [place], [place], [address] Phone: [phone]   Fax:   [phone]

## 2015-02-01 ENCOUNTER — Ambulatory Visit (INDEPENDENT_AMBULATORY_CARE_PROVIDER_SITE_OTHER): Payer: BLUE CROSS/BLUE SHIELD | Admitting: Physical Therapy

## 2015-02-01 DIAGNOSIS — M25622 Stiffness of left elbow, not elsewhere classified: Secondary | ICD-10-CM

## 2015-02-01 DIAGNOSIS — R531 Weakness: Secondary | ICD-10-CM | POA: Diagnosis not present

## 2015-02-01 NOTE — Therapy (Signed)
[place], [place], [address] Phone: [phone]   Fax:  [phone]  Physical Therapy Treatment  Patient Details  Name: Donald Olson MRN: [id number] Date of Birth: 12-Aug-1966 Referring Provider:  Elsie Saas, MD  Encounter Date: 02/01/2015      PT End of Session - 02/01/15 0728    Visit Number 5   Number of Visits 8   Date for PT Re-Evaluation 02/17/15   PT Start Time 0728   PT Stop Time 0801   PT Time Calculation (min) 33 min      Past Medical History  Diagnosis Date  . Hypertension   . Obesity   . Hyperlipidemia     Past Surgical History  Procedure Laterality Date  . Appendectomy  20  . Patellar tendon surgery      bilateral  . Eye surgery      There were no vitals filed for this visit.  Visit Diagnosis:  Weakness generalized  Decreased ROM of left elbow      Subjective Assessment - 02/01/15 0728    Subjective Pt saw MD yesterday and he cleared him from all restrictions and said he doesn't have to come back to PT anymore.    Patient Stated Goals regain strength   Currently in Pain? No/denies            [facility] PT Assessment - 02/01/15 0001    Assessment   Medical Diagnosis pt s/p bicep tendon repair 4 weeks ago   Onset Date/Surgical Date 12/06/14   AROM   AROM Assessment Site Forearm   Right/Left Elbow Left   Left Elbow Flexion 128   Left Elbow Extension 0   Strength   Strength Assessment Site Elbow;Shoulder   Right/Left Shoulder Left   Left Shoulder Flexion 5/5   Left Shoulder ABduction 5/5   Left Shoulder Internal Rotation 5/5   Left Shoulder External Rotation 5/5   Right/Left Elbow Left   Left Elbow Flexion 5/5   Left Elbow Extension 5/5   Right/Left hand Right   Left Hand Grip (lbs) 130                     [facility] Adult PT Treatment/Exercise - 02/01/15 0001    Shoulder Exercises: Supine   Other Supine Exercises snow angels on half  bolster.    Other Supine Exercises 10 x 10sec scapular squeezes on half bolster   Shoulder Exercises: Seated   Row Both;Weights  3x10   Row Weight (lbs) 3 plates   Other Seated Exercises 3x10 triceps 2 plates, biceps 1 plate with Lt UE only   Shoulder Exercises: Prone   Other Prone Exercises plank on EOB with walking side /side on hands, then with fitter 2 blue bands to fatigue   Shoulder Exercises: ROM/Strengthening   UBE (Upper Arm Bike) L4 x 3'/3'                     PT Long Term Goals - 02/01/15 0732    PT LONG TERM GOAL #1   Title Pt will improve Lt wrist ROM to equal Rt wrist ROM to improve functional abilities   Status Achieved   PT LONG TERM GOAL #2   Title Pt will improve Lt wrist strength to 5/5 to work without fatigue   Status Achieved   PT LONG TERM GOAL #3   Title Pt will improve FOTO to 30% to demo improved functional abilities  Status Not Met  scored 33% limited.    PT LONG TERM GOAL #4   Title demo Lt elbow and shoulder ROM WNL to allow over head activity   Status Achieved   PT LONG TERM GOAL #5   Title increase strength Lt shoulder and elbow =/> 5-/5 to allow return to prior level with activity   Status Achieved   PT LONG TERM GOAL #6   Title weight bear through his UE's without pain or difficulty to allow him to ride his motorcycle   Status Achieved               Plan - 02/01/15 0800    Clinical Impression Statement Pt continues to do very well, saw MD yesterday and he was very pleased. Told pt he doesn't have to continue with PT and can return to prior activity and motorcycle riding.    Rehab Potential Excellent   PT Next Visit Plan D/C to HEP to include gym program   Consulted and Agree with Plan of Care Patient        Problem List Patient Active Problem List   Diagnosis Date Noted  . Gout 11/08/2014  . Rupture of distal biceps tendon 11/08/2014  . Left hip pain 10/06/2012  . IFG (impaired fasting glucose) 05/12/2012  .  OBESITY, CLASS II 11/12/2010  . EXTERNAL HEMORRHOIDS 03/26/2010  . Hyperlipidemia 09/19/2009  . ESSENTIAL HYPERTENSION, BENIGN 09/18/2009    Jeral Pinch, PT 02/01/2015, 8:06 AM  [facility] [place], [place], [address] Phone: [phone]   Fax:  [phone]    PHYSICAL THERAPY DISCHARGE SUMMARY  Visits from Start of Care: 5  Current functional level related to goals / functional outcomes: See above, pt to ride his motorcycle today   Remaining deficits: As above   Education / Equipment: HEP Plan: Patient agrees to discharge.  Patient goals were partially met. Patient is being discharged due to being pleased with the current functional level.  ?????   Jeral Pinch, PT

## 2015-02-08 ENCOUNTER — Encounter: Payer: BLUE CROSS/BLUE SHIELD | Admitting: Physical Therapy

## 2015-02-20 ENCOUNTER — Ambulatory Visit: Payer: BLUE CROSS/BLUE SHIELD | Admitting: Family Medicine

## 2015-04-03 ENCOUNTER — Ambulatory Visit: Payer: BLUE CROSS/BLUE SHIELD | Admitting: Family Medicine

## 2015-05-01 ENCOUNTER — Ambulatory Visit (INDEPENDENT_AMBULATORY_CARE_PROVIDER_SITE_OTHER): Payer: BLUE CROSS/BLUE SHIELD | Admitting: Family Medicine

## 2015-05-01 ENCOUNTER — Encounter: Payer: Self-pay | Admitting: Family Medicine

## 2015-05-01 VITALS — BP 140/90 | HR 60 | Ht 71.0 in | Wt 299.0 lb

## 2015-05-01 DIAGNOSIS — I10 Essential (primary) hypertension: Secondary | ICD-10-CM | POA: Diagnosis not present

## 2015-05-01 MED ORDER — LOSARTAN POTASSIUM-HCTZ 100-25 MG PO TABS: 1.0000 | ORAL_TABLET | Freq: Every day | ORAL | Status: DC

## 2015-05-01 MED ORDER — ATORVASTATIN CALCIUM 80 MG PO TABS: 80.0000 mg | ORAL_TABLET | Freq: Every day | ORAL | Status: DC

## 2015-05-01 MED ORDER — AMLODIPINE BESYLATE 10 MG PO TABS: 10.0000 mg | ORAL_TABLET | Freq: Every day | ORAL | Status: DC

## 2015-05-01 NOTE — Progress Notes (Signed)
   Subjective:    Patient ID: Donald Olson, male    DOB: 14-Nov-1965, 49 y.o.   MRN: [id number]  HPI Hypertension- Pt denies chest pain, SOB, dizziness, or heart palpitations.  Taking meds as directed w/o problems.  Denies medication side effects.  He is out of medication. We had added hydrochlorothiazide to his regimen back in April. He also has a form for me to complete to be part of an investigational study that is focusing around diet next her size. They're using a fit bit to help track calories and exercise activity levels. Unfortunately he has gained about 16 pounds since he was last year.    Review of Systems     Objective:   Physical Exam  Constitutional: He is oriented to person, place, and time. He appears well-developed and well-nourished.  HENT:  Head: Normocephalic and atraumatic.  Cardiovascular: Normal rate, regular rhythm and normal heart sounds.   Pulmonary/Chest: Effort normal and breath sounds normal.  Neurological: He is alert and oriented to person, place, and time.  Skin: Skin is warm and dry.  Psychiatric: He has a normal mood and affect. His behavior is normal.          Assessment & Plan:  HTN - Uncontrolled.  Will add HCTZ to regimen. Completed form for him to [place] the [employer] exercise and diet research study. I think this is absolutely fantastic. They're using a separate bit to help track his activity levels. He says really he has learned a lot about himself. He thought he was a lot more active than he really is and has been trying to shoot for 10,000 steps a day. Follow-up in 2 months. Hopefully blood pressure we'll look better and he will have lost some weight.

## 2015-05-17 ENCOUNTER — Telehealth: Payer: Self-pay | Admitting: *Deleted

## 2015-05-17 MED ORDER — LOSARTAN POTASSIUM 100 MG PO TABS: 100.0000 mg | ORAL_TABLET | Freq: Every day | ORAL | Status: DC

## 2015-05-17 MED ORDER — METOPROLOL SUCCINATE ER 50 MG PO TB24
50.0000 mg | ORAL_TABLET | Freq: Every day | ORAL | Status: DC
Start: 2015-05-17 — End: 2016-01-15

## 2015-05-17 NOTE — Telephone Encounter (Signed)
Pt called and lvm stating that he is having an allergic reaction to the new bp med that was given to him. He stated that he experienced a bumpy rash from his knees going down also swelling of both of his legs and feet. He reports that he d/c'd the med on Saturday and noticed that the swelling went down. He stated that he took benadryl which helped. Will fwd to pcp for advice medication added to allergy list..Deondra Wigger, Viann Shove

## 2015-05-17 NOTE — Telephone Encounter (Signed)
Okay. Once swelling and rash have completely resolved then we can start a new blood pressure pill called metoprolol. I also want him to restart the losartan by itself since he tolerated that well. I did go ahead and send a new prescription in case he needed a refill. Also make sure to continue the amlodipine.

## 2015-05-17 NOTE — Telephone Encounter (Signed)
Called pt and informed him of recommendations.Loralee Pacas [place]

## 2015-07-03 ENCOUNTER — Ambulatory Visit: Payer: BLUE CROSS/BLUE SHIELD | Admitting: Family Medicine

## 2015-07-31 ENCOUNTER — Ambulatory Visit: Payer: BLUE CROSS/BLUE SHIELD | Admitting: Family Medicine

## 2015-08-15 ENCOUNTER — Telehealth: Payer: Self-pay | Admitting: *Deleted

## 2015-08-15 NOTE — Telephone Encounter (Signed)
Called pt and lvm informing him that he is allergic to HCTZ. He is NOT taking this anymore. And for him to continue on the Metoprolol and losartan. Advised that he call back should he have any problems with either of these medications.Donald PacasBarkley, Donald Masi VanceLynetta

## 2016-01-05 ENCOUNTER — Ambulatory Visit (INDEPENDENT_AMBULATORY_CARE_PROVIDER_SITE_OTHER): Payer: BLUE CROSS/BLUE SHIELD | Admitting: Physician Assistant

## 2016-01-05 ENCOUNTER — Encounter: Payer: Self-pay | Admitting: Physician Assistant

## 2016-01-05 VITALS — BP 137/63 | HR 55 | Temp 98.5°F | Ht 71.0 in | Wt 304.0 lb

## 2016-01-05 DIAGNOSIS — R300 Dysuria: Secondary | ICD-10-CM

## 2016-01-05 DIAGNOSIS — R319 Hematuria, unspecified: Secondary | ICD-10-CM | POA: Diagnosis not present

## 2016-01-05 DIAGNOSIS — N3001 Acute cystitis with hematuria: Secondary | ICD-10-CM | POA: Diagnosis not present

## 2016-01-05 LAB — POCT URINALYSIS DIPSTICK
Bilirubin, UA: NEGATIVE
Glucose, UA: NEGATIVE
Ketones, UA: NEGATIVE
LEUKOCYTES UA: NEGATIVE
Nitrite, UA: POSITIVE
Spec Grav, UA: >=1.03
UROBILINOGEN UA: 0.2
pH, UA: 6

## 2016-01-05 MED ORDER — TAMSULOSIN HCL 0.4 MG PO CAPS: 0.4000 mg | ORAL_CAPSULE | Freq: Every day | ORAL | Status: DC

## 2016-01-05 MED ORDER — CIPROFLOXACIN HCL 500 MG PO TABS: 500.0000 mg | ORAL_TABLET | Freq: Two times a day (BID) | ORAL | Status: DC

## 2016-01-05 NOTE — Progress Notes (Addendum)
   Subjective:    Patient ID: Donald Olson, male    DOB: 1966-03-16, 50 y.o.   MRN: [id number]  HPI Patient presents today complaining of visible blood in his urine x2 days. Denies fever, chills, difficulty urinating, urinary frequency, urgency, or retention. He does report some dysuria at the end of urination and a severe amount of pressure with urination. He had some suprapubic pain last week but that resolved.    Review of Systems  Constitutional: Negative for fever, chills and fatigue.  Genitourinary: Positive for dysuria and hematuria. Negative for urgency, frequency, flank pain, decreased urine volume, discharge, difficulty urinating and penile pain.  All other systems reviewed and are negative.      Objective:   Physical Exam  Constitutional: He is oriented to person, place, and time. He appears well-developed and well-nourished.  Morbidly obese  HENT:  Head: Normocephalic and atraumatic.  Cardiovascular: Normal rate, regular rhythm and normal heart sounds.   Pulmonary/Chest: Effort normal and breath sounds normal. No respiratory distress. He has no wheezes. He has no rales. He exhibits no tenderness.  No CVA tenderness.   Abdominal: Soft. Bowel sounds are normal. He exhibits no distension and no mass. There is no tenderness. There is no rebound and no guarding.  Neurological: He is alert and oriented to person, place, and time.  Skin: Skin is warm and dry.          Assessment & Plan:  1. Urinary Tract Infection- Patient presents with a 2 day history of visible blood in the urine. No CVA or suprapubic tenderness on palpation. UA with Large Blood, Trace protein, +Nitrates, -Leuks. Will treat with 10 days of Ciprofloxacin. He also has some urinary pressure that could represent a stone passing. I do not think prostatitis at this time due to no frequency, urgency, weak stream. Will also send Flomax.follow up as needed. If blood not resolving follow up.   2. Morbid Obesity-  Discussed weight loss with patient. He is uninterested in trying weight loss meds at this time. He would like wait and talk to PCP at [facility].

## 2016-01-05 NOTE — Patient Instructions (Signed)

## 2016-01-07 LAB — URINE CULTURE
COLONY COUNT: NO GROWTH
ORGANISM ID, BACTERIA: NO GROWTH

## 2016-01-11 ENCOUNTER — Telehealth: Payer: Self-pay | Admitting: *Deleted

## 2016-01-11 DIAGNOSIS — R319 Hematuria, unspecified: Secondary | ICD-10-CM

## 2016-01-11 NOTE — Telephone Encounter (Signed)
[facility].Loralee PacasBarkley, Hilliard Borges BowmanLynetta

## 2016-01-11 NOTE — Telephone Encounter (Signed)
Spoke w/pt and he stated that he is still noticing some blood in his urine. He states that he notices this mostly when he has a BM not standing and urinating. He has had a vasectomy (2013/[name]) and is wondering if it could be scar tissue or something else. He denies fever, abd pain, burning with urination. He is ok with referral to urology does not want to go back to where he had vasectomy done.Donald PacasBarkley, Donald Olson Donald HillLynetta

## 2016-01-15 ENCOUNTER — Other Ambulatory Visit: Payer: Self-pay | Admitting: Family Medicine

## 2016-01-29 DIAGNOSIS — N3001 Acute cystitis with hematuria: Secondary | ICD-10-CM | POA: Diagnosis not present

## 2016-02-12 ENCOUNTER — Other Ambulatory Visit: Payer: Self-pay | Admitting: Family Medicine

## 2016-02-12 DIAGNOSIS — N3001 Acute cystitis with hematuria: Secondary | ICD-10-CM | POA: Diagnosis not present

## 2016-02-22 DIAGNOSIS — R310 Gross hematuria: Secondary | ICD-10-CM | POA: Insufficient documentation

## 2016-02-22 DIAGNOSIS — Z792 Long term (current) use of antibiotics: Secondary | ICD-10-CM | POA: Diagnosis not present

## 2016-02-22 DIAGNOSIS — R29898 Other symptoms and signs involving the musculoskeletal system: Secondary | ICD-10-CM | POA: Diagnosis not present

## 2016-03-10 ENCOUNTER — Other Ambulatory Visit: Payer: Self-pay | Admitting: Family Medicine

## 2016-04-13 ENCOUNTER — Other Ambulatory Visit: Payer: Self-pay | Admitting: Family Medicine

## 2016-04-17 ENCOUNTER — Telehealth: Payer: Self-pay | Admitting: *Deleted

## 2016-04-17 DIAGNOSIS — R600 Localized edema: Secondary | ICD-10-CM

## 2016-04-17 MED ORDER — FUROSEMIDE 20 MG PO TABS: 20.0000 mg | ORAL_TABLET | Freq: Every day | ORAL | 0 refills | Status: DC | PRN

## 2016-04-17 NOTE — Telephone Encounter (Signed)
Pt informed that a short supply of Lasix will be sent to his pharmacy. Also informed that he will need to schedule a f/u appt for bp.Laureen Ochs.Saphyre Cillo, Viann Shoveonya [name]

## 2016-04-25 IMAGING — CT CT ABD-PELV W/ CM
2 of 5 series · 13 of 32 positions shown, 18 images · IV contrast (omnipaque)
Comparison: Ultrasound 12/26/2014.  LEFT hip radiographs 10/06/2012

CLINICAL DATA: RIGHT groin pain. RIGHT lower quadrant pain. Initial
encounter.

EXAM:
CT ABDOMEN AND PELVIS WITH CONTRAST
TECHNIQUE: Multidetector CT imaging of the abdomen and pelvis was performed
using the standard protocol following bolus administration of
intravenous contrast.
CONTRAST:  100mL OMNIPAQUE IOHEXOL 300 MG/ML  SOLN

[Series 2: abd/pelvis with · axial · 0.89mm/px · z∈[-445,-65]mm · 7 of 102 slices shown, 12 images]
[im 13/102  soft-tissue]
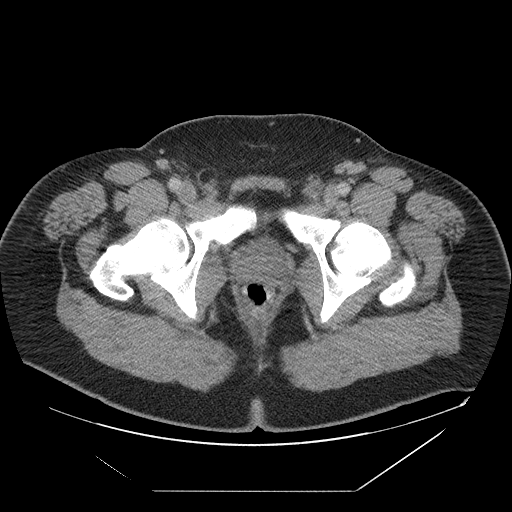
[im 13/102  bone]
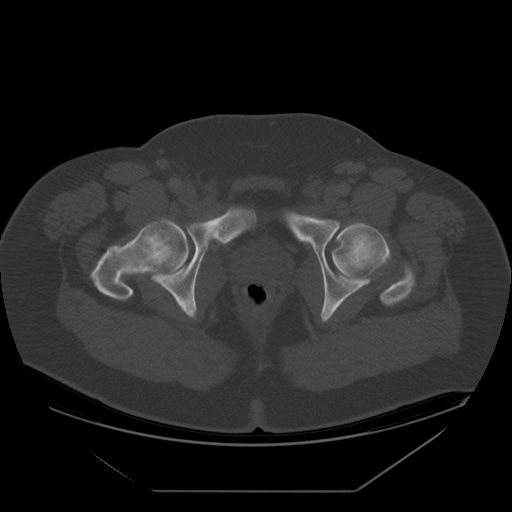
[im 26/102  soft-tissue]
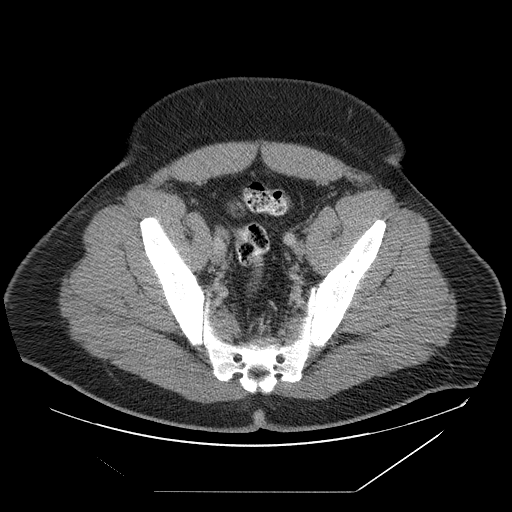
[im 38/102  soft-tissue]
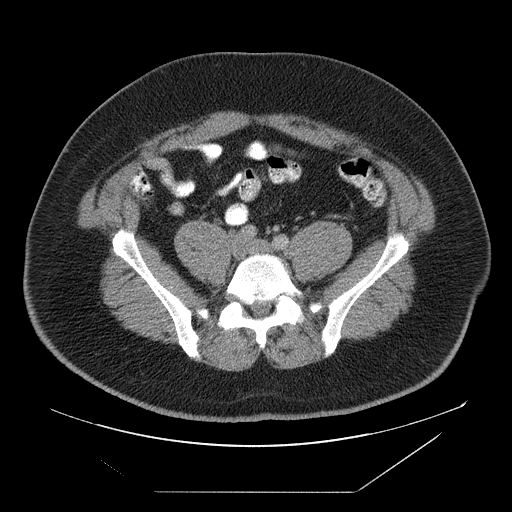
[im 51/102  soft-tissue]
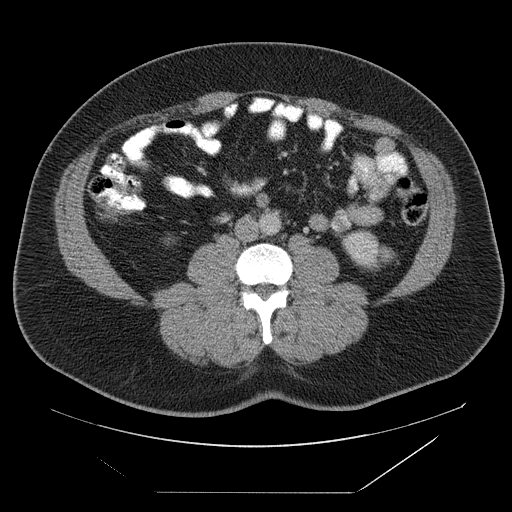
[im 51/102  lung]
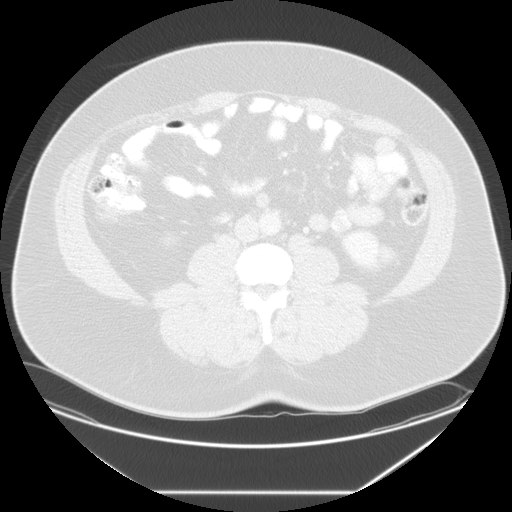
[im 64/102  soft-tissue]
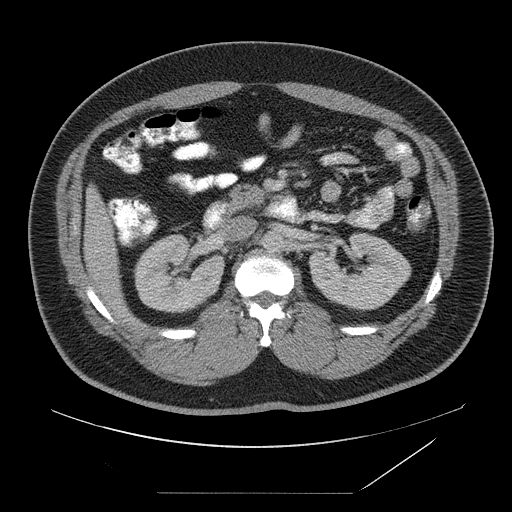
[im 64/102  lung]
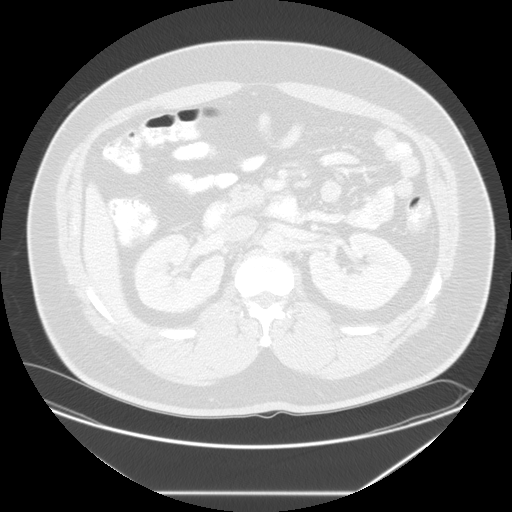
[im 76/102  soft-tissue]
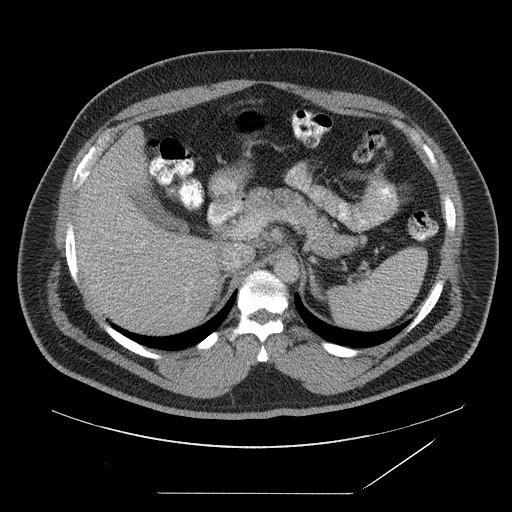
[im 76/102  lung]
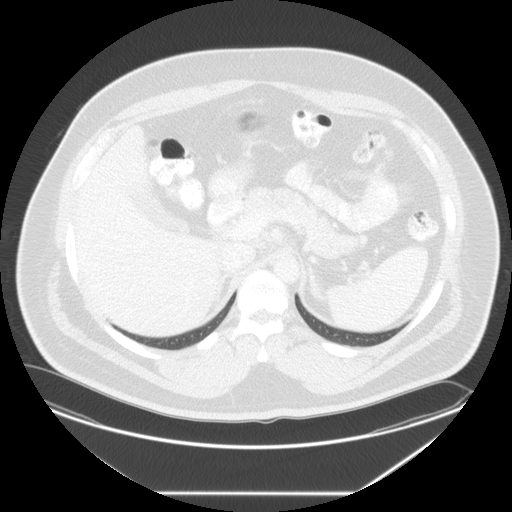
[im 89/102  soft-tissue]
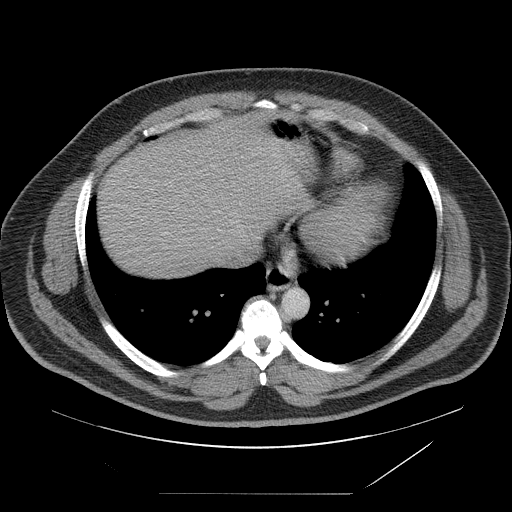
[im 89/102  lung]
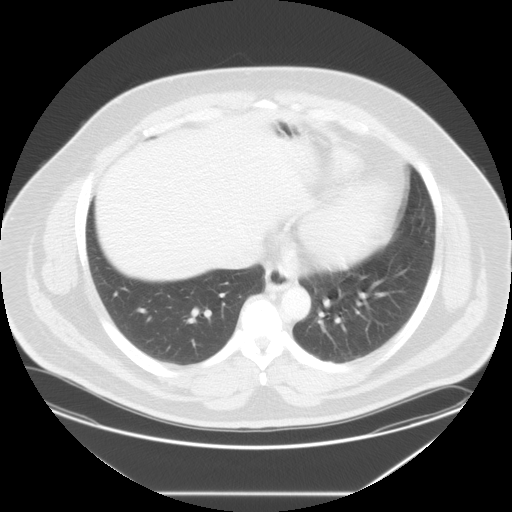

[Series 400: sagittal · sagittal · 1.10mm/px · 6 of 152 slices shown]
[im 16/152  soft-tissue]
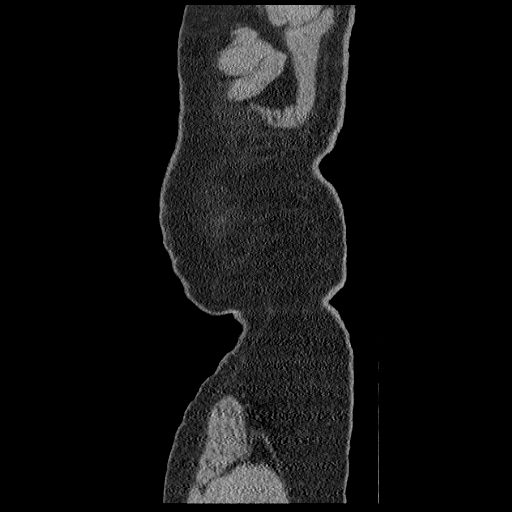
[im 31/152  soft-tissue]
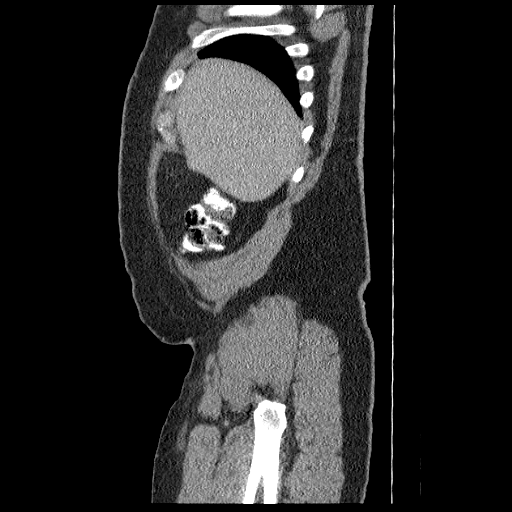
[im 46/152  soft-tissue]
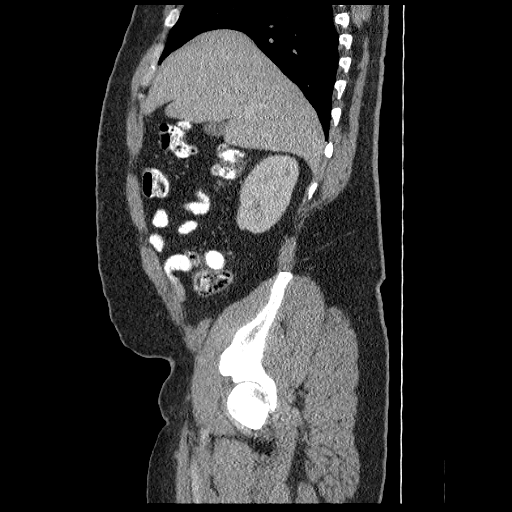
[im 61/152  soft-tissue]
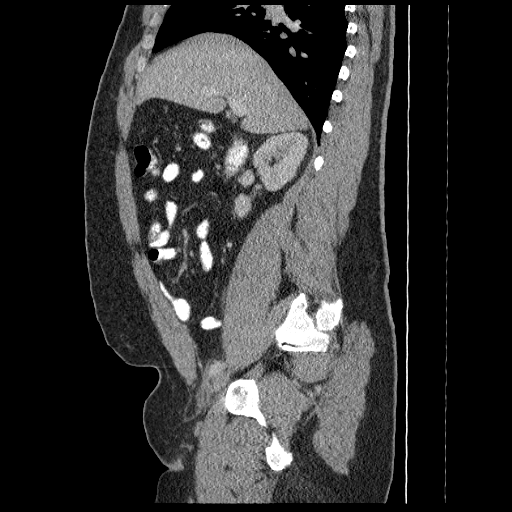
[im 91/152  soft-tissue]
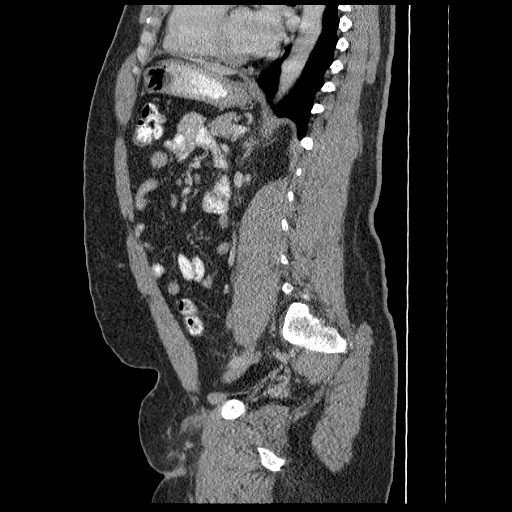
[im 106/152  soft-tissue]
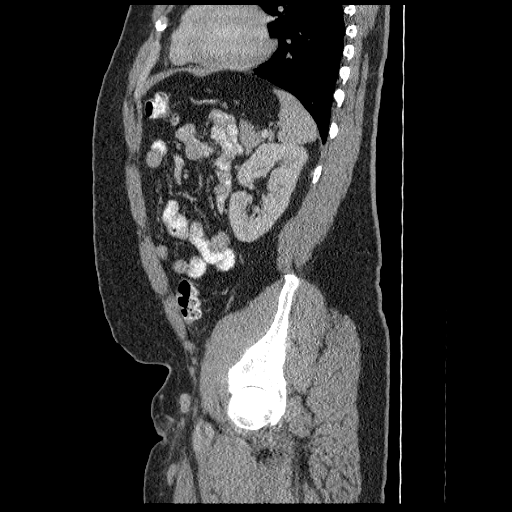

[13 of 32 positions shown; findings below may reference images not displayed]

FINDINGS: Musculoskeletal: Partially visualized large mass in the proximal
LEFT thigh. This is interposed between the proximal rectus femoris
and the tensor fascia lata. Although incompletely visualized, this
measures 7.8 cm transverse by 6.5 cm AP. Small calcifications are
present within the mass. If this is a new diagnosis, further MR
imaging of the proximal LEFT thigh is recommended with and without
IV gadolinium contrast. The coronal images suggest this is within
the indirect head of the rectus femoris

Lung Bases: Atelectasis.

Liver:  Normal.

Spleen:  Normal.

Gallbladder:  Contracted.

Common bile duct:  Normal.

Pancreas:  Normal.

Adrenal glands:  Normal bilaterally.

Kidneys: Renal enhancement is within normal limits. Probable tiny
cyst in the interpolar Lower polar LEFT kidney.

Stomach:  Normal.

Small bowel:  Normal.  No inflammatory changes or bowel obstruction.

Colon: Appendectomy. No inflammatory changes or obstruction of
colon.

Pelvic Genitourinary:  Normal.

Peritoneum: No free air or free fluid.  No mesenteric adenopathy.

Vasculature: Mild atherosclerosis.

Body Wall: Fat containing periumbilical hernia. Mild stranding is
present in the LEFT inguinal region, which is a nonspecific finding.
This extends to the inguinal crease. No inguinal hernia in this
patient with RIGHT groin pain. RIGHT inguinal canal appears normal.
No RIGHT inguinal or external iliac adenopathy.

Borderline LEFT 1 cm external iliac lymph nodes incidentally noted
IMPRESSION: 1. Partially visible 8 cm LEFT proximal thigh mass with
calcifications. MRI with and without contrast would be the next best
test for further characterization. In this location, the primary
considerations are abscess, lymphadenopathy, hematoma/myositis
ossificans or soft tissue sarcoma.
2. No acute abdominal abnormality.
3. Borderline LEFT external iliac lymph nodes are nonspecific. These
may be reactive or neoplastic.
4. Soft tissue stranding and infiltration in the LEFT inguinal
crease is also nonspecific and most commonly associated with
cutaneous infection.

## 2016-05-06 ENCOUNTER — Ambulatory Visit (INDEPENDENT_AMBULATORY_CARE_PROVIDER_SITE_OTHER): Payer: BLUE CROSS/BLUE SHIELD | Admitting: Family Medicine

## 2016-05-06 ENCOUNTER — Other Ambulatory Visit: Payer: Self-pay | Admitting: Family Medicine

## 2016-05-06 ENCOUNTER — Encounter: Payer: Self-pay | Admitting: Family Medicine

## 2016-05-06 VITALS — BP 130/78 | HR 53 | Ht 71.0 in | Wt 306.0 lb

## 2016-05-06 DIAGNOSIS — R7301 Impaired fasting glucose: Secondary | ICD-10-CM

## 2016-05-06 DIAGNOSIS — I10 Essential (primary) hypertension: Secondary | ICD-10-CM | POA: Diagnosis not present

## 2016-05-06 DIAGNOSIS — M109 Gout, unspecified: Secondary | ICD-10-CM

## 2016-05-06 DIAGNOSIS — Z23 Encounter for immunization: Secondary | ICD-10-CM

## 2016-05-06 DIAGNOSIS — M7989 Other specified soft tissue disorders: Secondary | ICD-10-CM | POA: Diagnosis not present

## 2016-05-06 DIAGNOSIS — D649 Anemia, unspecified: Secondary | ICD-10-CM

## 2016-05-06 DIAGNOSIS — E785 Hyperlipidemia, unspecified: Secondary | ICD-10-CM

## 2016-05-06 LAB — POCT URINALYSIS DIPSTICK
BILIRUBIN UA: NEGATIVE
GLUCOSE UA: NEGATIVE
Leukocytes, UA: NEGATIVE
Nitrite, UA: NEGATIVE
Protein, UA: NEGATIVE
RBC UA: NEGATIVE
SPEC GRAV UA: 1.02
UROBILINOGEN UA: 1
pH, UA: 6

## 2016-05-06 LAB — COMPLETE METABOLIC PANEL WITH GFR
ALT: 18 U/L (ref 9–46)
AST: 15 U/L (ref 10–40)
Albumin: 4.4 g/dL (ref 3.6–5.1)
Alkaline Phosphatase: 59 U/L (ref 40–115)
BUN: 12 mg/dL (ref 7–25)
CHLORIDE: 107 mmol/L (ref 98–110)
CO2: 26 mmol/L (ref 20–31)
Calcium: 9.2 mg/dL (ref 8.6–10.3)
Creat: 1.1 mg/dL (ref 0.60–1.35)
GFR, Est African American: >89 mL/min (ref >=60)
GFR, Est Non African American: 79 mL/min (ref >=60)
GLUCOSE: 107 mg/dL — AB (ref 65–99)
POTASSIUM: 4.4 mmol/L (ref 3.5–5.3)
SODIUM: 142 mmol/L (ref 135–146)
Total Bilirubin: 0.8 mg/dL (ref 0.2–1.2)
Total Protein: 7 g/dL (ref 6.1–8.1)

## 2016-05-06 LAB — POCT GLYCOSYLATED HEMOGLOBIN (HGB A1C): Hemoglobin A1C: 6.2

## 2016-05-06 LAB — LIPID PANEL
CHOL/HDL RATIO: 5 ratio (ref <=5.0)
Cholesterol: 160 mg/dL (ref 125–200)
HDL: 32 mg/dL — ABNORMAL LOW (ref >=40)
LDL Cholesterol: 106 mg/dL (ref <130)
Triglycerides: 111 mg/dL (ref <150)
VLDL: 22 mg/dL (ref <30)

## 2016-05-06 LAB — URIC ACID: Uric Acid, Serum: 6.6 mg/dL (ref 4.0–8.0)

## 2016-05-06 MED ORDER — METOPROLOL SUCCINATE ER 50 MG PO TB24: 50.0000 mg | ORAL_TABLET | Freq: Every day | ORAL | 1 refills | Status: DC

## 2016-05-06 MED ORDER — AMLODIPINE BESYLATE 10 MG PO TABS: 10.0000 mg | ORAL_TABLET | Freq: Every day | ORAL | 1 refills | Status: DC

## 2016-05-06 MED ORDER — ATORVASTATIN CALCIUM 80 MG PO TABS: 80.0000 mg | ORAL_TABLET | Freq: Every day | ORAL | 1 refills | Status: DC

## 2016-05-06 NOTE — Progress Notes (Signed)
Subjective:    CC: HTN   HPI: Hypertension- Pt denies chest pain, SOB, dizziness, or heart palpitations.  Taking meds as directed w/o problems.  Denies medication side effects.  Has been under a lot of stress. 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    IFG - No increased thirst or urination.  He also complains alert she may swelling that started about 2 months ago. He denies any injury or trauma. He can't lumbar anything specific that may have caused it. He did call into the office about 2 weeks ago and we decided: Lasix for 10 days to take as needed. He says he doesn't really feel like it actually has helped the swelling. He's not really had a lot of pain or discomfort with it. No chest pain or shortness of breath.  He has gained 20 lbs in the last year.    Past medical history, Surgical history, Family history not pertinant except as noted below, Social history, Allergies, and medications have been entered into the medical record, reviewed, and corrections made.   Review of Systems: No fevers, chills, night sweats, weight loss, chest pain, or shortness of breath.   Objective:    General: Well Developed, well nourished, and in no acute distress.  Neuro: Alert and oriented x3, extra-ocular muscles intact, sensation grossly intact.  HEENT: Normocephalic, atraumatic  Skin: Warm and dry, no rashes. Cardiac: Regular rate and rhythm, no murmurs rubs or gallops, no lower extremity edema.  Respiratory: Clear to auscultation bilaterally. Not using accessory muscles, speaking in full sentences.   Impression and Recommendations:   HTN - Well controlled. Continue current regimen. Follow-up in 6 months.  IFG - A1c looks great today. Continue to monitor every 6 months.  Lab Results  Component Value Date   HGBA1C 6.2 05/06/2016   Lower leg swelling. Unclear etiology. We'll evaluate further for thyroid disorder, anemia, proteinuria etc. Consider that it could be venous stasis. He  did not respond well to 20 mg of Lasix. Did really encourage him to make sure that he is watching his salt intake and to work on weight loss.  Obesity/BMI > 40 - encouraged him to work on weight loss and exercise.

## 2016-05-07 LAB — CBC WITH DIFFERENTIAL/PLATELET
BASOS ABS: 0 {cells}/uL (ref 0–200)
BASOS PCT: 0 %
EOS ABS: 110 {cells}/uL (ref 15–500)
Eosinophils Relative: 2 %
HEMATOCRIT: 35.1 % — AB (ref 38.5–50.0)
Hemoglobin: 11.8 g/dL — ABNORMAL LOW (ref 13.2–17.1)
LYMPHS PCT: 44 %
Lymphs Abs: 2420 cells/uL (ref 850–3900)
MCH: 29.1 pg (ref 27.0–33.0)
MCHC: 33.6 g/dL (ref 32.0–36.0)
MCV: 86.5 fL (ref 80.0–100.0)
MONO ABS: 495 {cells}/uL (ref 200–950)
MPV: 8.7 fL (ref 7.5–12.5)
Monocytes Relative: 9 %
NEUTROS PCT: 45 %
Neutro Abs: 2475 cells/uL (ref 1500–7800)
Platelets: 332 10*3/uL (ref 140–400)
RBC: 4.06 MIL/uL — ABNORMAL LOW (ref 4.20–5.80)
RDW: 15.6 % — AB (ref 11.0–15.0)
WBC: 5.5 10*3/uL (ref 3.8–10.8)

## 2016-05-07 LAB — B12 AND FOLATE PANEL
Folate: >24 ng/mL (ref >5.4)
Vitamin B-12: 727 pg/mL (ref 200–1100)

## 2016-05-07 LAB — BRAIN NATRIURETIC PEPTIDE: Brain Natriuretic Peptide: 5.1 pg/mL (ref <100)

## 2016-05-07 LAB — FERRITIN: FERRITIN: 119 ng/mL (ref 20–380)

## 2016-05-07 LAB — TSH: TSH: 2.01 mIU/L (ref 0.40–4.50)

## 2016-05-08 ENCOUNTER — Other Ambulatory Visit: Payer: Self-pay | Admitting: *Deleted

## 2016-05-08 DIAGNOSIS — D649 Anemia, unspecified: Secondary | ICD-10-CM

## 2016-05-08 NOTE — Addendum Note (Signed)
Addended by: Deno EtienneBARKLEY, Basma Buchner L on: 05/08/2016 07:56 AM   Modules accepted: Orders

## 2016-05-27 ENCOUNTER — Other Ambulatory Visit: Payer: Self-pay | Admitting: Family Medicine

## 2016-06-12 ENCOUNTER — Other Ambulatory Visit: Payer: Self-pay | Admitting: Family Medicine

## 2016-12-12 ENCOUNTER — Other Ambulatory Visit: Payer: Self-pay | Admitting: Family Medicine

## 2016-12-16 LAB — VITAMIN B12: VITAMIN B 12: 646

## 2016-12-27 ENCOUNTER — Other Ambulatory Visit: Payer: Self-pay | Admitting: Family Medicine

## 2017-02-19 LAB — HM COLONOSCOPY

## 2017-03-17 DIAGNOSIS — I10 Essential (primary) hypertension: Secondary | ICD-10-CM | POA: Diagnosis not present

## 2017-03-17 DIAGNOSIS — Z833 Family history of diabetes mellitus: Secondary | ICD-10-CM | POA: Diagnosis not present

## 2017-03-17 DIAGNOSIS — D649 Anemia, unspecified: Secondary | ICD-10-CM | POA: Diagnosis not present

## 2017-03-17 DIAGNOSIS — E669 Obesity, unspecified: Secondary | ICD-10-CM | POA: Diagnosis not present

## 2017-03-24 DIAGNOSIS — M2141 Flat foot [pes planus] (acquired), right foot: Secondary | ICD-10-CM | POA: Diagnosis not present

## 2017-03-24 DIAGNOSIS — R609 Edema, unspecified: Secondary | ICD-10-CM | POA: Diagnosis not present

## 2017-03-24 DIAGNOSIS — M2142 Flat foot [pes planus] (acquired), left foot: Secondary | ICD-10-CM | POA: Diagnosis not present

## 2017-03-24 DIAGNOSIS — L853 Xerosis cutis: Secondary | ICD-10-CM | POA: Diagnosis not present

## 2017-03-24 DIAGNOSIS — M19071 Primary osteoarthritis, right ankle and foot: Secondary | ICD-10-CM | POA: Diagnosis not present

## 2017-03-24 DIAGNOSIS — M7731 Calcaneal spur, right foot: Secondary | ICD-10-CM | POA: Diagnosis not present

## 2017-03-24 DIAGNOSIS — I10 Essential (primary) hypertension: Secondary | ICD-10-CM | POA: Diagnosis not present

## 2017-03-24 DIAGNOSIS — M722 Plantar fascial fibromatosis: Secondary | ICD-10-CM | POA: Diagnosis not present

## 2017-03-27 ENCOUNTER — Other Ambulatory Visit: Payer: Self-pay | Admitting: Family Medicine

## 2017-05-19 DIAGNOSIS — M722 Plantar fascial fibromatosis: Secondary | ICD-10-CM | POA: Diagnosis not present

## 2017-06-16 ENCOUNTER — Encounter: Payer: Self-pay | Admitting: *Deleted

## 2017-06-16 ENCOUNTER — Emergency Department
Admission: EM | Admit: 2017-06-16 | Discharge: 2017-06-16 | Disposition: A | Discharge status: Home / Self Care | Payer: BLUE CROSS/BLUE SHIELD | Source: Home / Self Care | Attending: Family Medicine | Admitting: Family Medicine

## 2017-06-16 DIAGNOSIS — H01001 Unspecified blepharitis right upper eyelid: Secondary | ICD-10-CM | POA: Diagnosis not present

## 2017-06-16 MED ORDER — ERYTHROMYCIN 5 MG/GM OP OINT: TOPICAL_OINTMENT | OPHTHALMIC | 0 refills | Status: DC

## 2017-06-16 NOTE — ED Triage Notes (Signed)
Pt c/o RT eye lid swelling x 2 days. He reports some RT eye redness. He has instilled OTC eye gtts with some relief from the redness.

## 2017-06-16 NOTE — ED Provider Notes (Signed)
[facility]    CSN: [id number] Arrival date & time: 06/16/17  1019     History   Chief Complaint Chief Complaint  Patient presents with  . Eye Problem    HPI Donald Olson is a 51 y.o. male.   HPI  Donald Olson is a 51 y.o. male presenting to UC with c/o Right upper eyelid swelling, redness and mild irritation for 2 days.  He used some OTC eye drops with relief of eyeball redness. He wears glasses but not contacts. Denies trauma to eye. No recent URI symptoms.     Past Medical History:  Diagnosis Date  . Hyperlipidemia   . Hypertension   . Obesity     Patient Active Problem List   Diagnosis Date Noted  . Morbid obesity (HCC) 01/05/2016  . Gout 11/08/2014  . Rupture of distal biceps tendon 11/08/2014  . IFG (impaired fasting glucose) 05/12/2012  . OBESITY, CLASS II 11/12/2010  . EXTERNAL HEMORRHOIDS 03/26/2010  . Hyperlipidemia 09/19/2009  . ESSENTIAL HYPERTENSION, BENIGN 09/18/2009    Past Surgical History:  Procedure Laterality Date  . APPENDECTOMY  20  . EYE SURGERY    . patellar tendon surgery     bilateral       Home Medications    Prior to Admission medications   Medication Sig Start Date End Date Taking? Authorizing Provider  amLODipine (NORVASC) 10 MG tablet Take 1 tablet (10 mg total) by mouth daily. 06/12/16   Agapito Games, MD  atorvastatin (LIPITOR) 80 MG tablet TAKE 1 TABLET(80 MG) BY MOUTH DAILY AT 6 PM 03/27/17   Agapito Games, MD  erythromycin ophthalmic ointment Apply thin layer along upper eyelid/lashes 3-4 times daily for 7 days 06/16/17   Lurene Shadow, PA-C  losartan (COZAAR) 100 MG tablet TAKE 1 TABLET BY MOUTH DAILY 03/27/17   Agapito Games, MD  metoprolol succinate (TOPROL-XL) 50 MG 24 hr tablet TAKE 1 TABLET BY MOUTH DAILY 03/27/17   Agapito Games, MD    Family History Family History  Problem Relation Age of Onset  . Cancer Father 23       pancreatic  . Hypertension Father   .  Hypertension Brother   . Diabetes Unknown        cousin/uncle/ERSD    Social History Social History  Substance Use Topics  . Smoking status: Never Smoker  . Smokeless tobacco: Never Used  . Alcohol use No     Allergies   Hctz [hydrochlorothiazide]   Review of Systems Review of Systems  Constitutional: Negative for chills and fever.  HENT: Negative for congestion, sinus pain and sinus pressure.   Eyes: Positive for pain and redness. Negative for photophobia, discharge and visual disturbance.       Right upper eyelid     Physical Exam Triage Vital Signs ED Triage Vitals  Enc Vitals Group     BP 06/16/17 1045 (!) 154/83     Pulse Rate 06/16/17 1045 (!) 58     Resp 06/16/17 1045 18     Temp 06/16/17 1045 98.4 F (36.9 C)     Temp Source 06/16/17 1045 Oral     SpO2 06/16/17 1045 98 %     Weight 06/16/17 1045 (!) 316 lb (143.3 kg)     Height 06/16/17 1045  (1.778 m)     Head Circumference --      Peak Flow --      Pain Score 06/16/17 1046 0  Pain Loc --      Pain Edu? --      Excl. in GC? --    No data found.   Updated Vital Signs BP (!) 154/83 (BP Location: Left Arm)   Pulse (!) 58   Temp 98.4 F (36.9 C) (Oral)   Resp 18   Ht  (1.778 m)   Wt (!) 316 lb (143.3 kg)   SpO2 98%   BMI 45.34 kg/m   Visual Acuity Right Eye Distance: 20/20 Left Eye Distance: 20/30 Bilateral Distance: 20/20 (w/ glasses)  Right Eye Near:   Left Eye Near:    Bilateral Near:     Physical Exam  Constitutional: He is oriented to person, place, and time. He appears well-developed and well-nourished. No distress.  HENT:  Head: Normocephalic and atraumatic.  Nose: Nose normal.  Eyes: Pupils are equal, round, and reactive to light. Conjunctivae and EOM are normal. Right eye exhibits no discharge. Left eye exhibits no discharge. Right conjunctiva is not injected. No scleral icterus.    Right upper eyelid erythema and mild edema. Minimally tender. No bleeding or  discharge.   Neck: Normal range of motion.  Cardiovascular: Normal rate.   Pulmonary/Chest: Effort normal.  Musculoskeletal: Normal range of motion.  Neurological: He is alert and oriented to person, place, and time.  Skin: Skin is warm and dry. He is not diaphoretic.  Psychiatric: He has a normal mood and affect. His behavior is normal.  Nursing note and vitals reviewed.    UC Treatments / Results  Labs (all labs ordered are listed, but only abnormal results are displayed) Labs Reviewed - No data to display  EKG  EKG Interpretation None       Radiology No results found.  Procedures Procedures (including critical care time)  Medications Ordered in UC Medications - No data to display   Initial Impression / Assessment and Plan / UC Course  I have reviewed the triage vital signs and the nursing notes.  Pertinent labs & imaging results that were available during my care of the patient were reviewed by me and considered in my medical decision making (see chart for details).     Hx and exam c/w blepharitis Home care instructions provided F/u with PCP in 1 week if not improving, sooner if significantly worsening.  Final Clinical Impressions(s) / UC Diagnoses   Final diagnoses:  Blepharitis of right upper eyelid, unspecified type    New Prescriptions Discharge Medication List as of 06/16/2017 10:50 AM    START taking these medications   Details  erythromycin ophthalmic ointment Apply thin layer along upper eyelid/lashes 3-4 times daily for 7 days, Normal         Controlled Substance Prescriptions [place] Controlled Substance Registry consulted? Not Applicable   Rolla Plate 06/16/17 1156

## 2017-06-23 DIAGNOSIS — D649 Anemia, unspecified: Secondary | ICD-10-CM | POA: Diagnosis not present

## 2017-06-23 DIAGNOSIS — I10 Essential (primary) hypertension: Secondary | ICD-10-CM | POA: Diagnosis not present

## 2017-06-23 DIAGNOSIS — E669 Obesity, unspecified: Secondary | ICD-10-CM | POA: Diagnosis not present

## 2017-06-23 DIAGNOSIS — Z125 Encounter for screening for malignant neoplasm of prostate: Secondary | ICD-10-CM | POA: Diagnosis not present

## 2017-06-23 LAB — BASIC METABOLIC PANEL
BUN: 10 (ref 4–21)
CALCIUM: 8.8
CREATININE: 0.9 (ref 0.6–1.3)
Glucose: 105
Potassium: 4 (ref 3.4–5.3)
SODIUM: 137 (ref 137–147)

## 2017-06-23 LAB — PSA: PSA: 1.11

## 2017-06-23 LAB — HEMOGLOBIN A1C: HEMOGLOBIN A1C: 6.7

## 2017-06-23 LAB — TSH: TSH: 1.97 (ref 0.41–5.90)

## 2017-06-23 LAB — HEPATIC FUNCTION PANEL
ALT: 30 (ref 10–40)
AST: 26 (ref 14–40)
Alkaline Phosphatase: 79 (ref 25–125)
BILIRUBIN DIRECT: 0.2 (ref 0.01–0.4)
Bilirubin, Total: 0.7

## 2017-06-23 LAB — LIPID PANEL
Cholesterol: 168 (ref 0–200)
HDL: 29 — AB (ref 35–70)
LDL CALC: 106
TRIGLYCERIDES: 164 — AB (ref 40–160)

## 2017-06-23 LAB — VITAMIN D 25 HYDROXY (VIT D DEFICIENCY, FRACTURES): Vit D, 25-Hydroxy: 9.13

## 2017-06-23 LAB — CBC AND DIFFERENTIAL
Hemoglobin: 12.2 — AB (ref 13.5–17.5)
PLATELETS: 382 (ref 150–399)
WBC: 6.6

## 2017-06-30 ENCOUNTER — Other Ambulatory Visit: Payer: Self-pay | Admitting: Family Medicine

## 2017-07-24 ENCOUNTER — Other Ambulatory Visit: Payer: Self-pay | Admitting: Family Medicine

## 2017-07-25 ENCOUNTER — Other Ambulatory Visit: Payer: Self-pay | Admitting: *Deleted

## 2017-07-28 ENCOUNTER — Other Ambulatory Visit: Payer: Self-pay | Admitting: Family Medicine

## 2017-08-11 ENCOUNTER — Ambulatory Visit: Payer: BLUE CROSS/BLUE SHIELD | Admitting: Family Medicine

## 2017-08-11 ENCOUNTER — Encounter: Payer: Self-pay | Admitting: Family Medicine

## 2017-08-11 VITALS — BP 138/78 | HR 53 | Ht 71.0 in | Wt 315.0 lb

## 2017-08-11 DIAGNOSIS — D649 Anemia, unspecified: Secondary | ICD-10-CM

## 2017-08-11 DIAGNOSIS — L918 Other hypertrophic disorders of the skin: Secondary | ICD-10-CM | POA: Diagnosis not present

## 2017-08-11 DIAGNOSIS — Z0000 Encounter for general adult medical examination without abnormal findings: Secondary | ICD-10-CM | POA: Diagnosis not present

## 2017-08-11 NOTE — Patient Instructions (Signed)
 Health Maintenance, Male A healthy lifestyle and preventive care is important for your health and wellness. Ask your health care provider about what schedule of regular examinations is right for you. What should I know about weight and diet? Eat a Healthy Diet  Eat plenty of vegetables, fruits, whole grains, low-fat dairy products, and lean protein.  Do not eat a lot of foods high in solid fats, added sugars, or salt.  Maintain a Healthy Weight Regular exercise can help you achieve or maintain a healthy weight. You should:  Do at least 150 minutes of exercise each week. The exercise should increase your heart rate and make you sweat (moderate-intensity exercise).  Do strength-training exercises at least twice a week.  Watch Your Levels of Cholesterol and Blood Lipids  Have your blood tested for lipids and cholesterol every 5 years starting at 50 years of age. If you are at high risk for heart disease, you should start having your blood tested when you are 50 years old. You may need to have your cholesterol levels checked more often if: ? Your lipid or cholesterol levels are high. ? You are older than 50 years of age. ? You are at high risk for heart disease.  What should I know about cancer screening? Many types of cancers can be detected early and may often be prevented. Lung Cancer  You should be screened every year for lung cancer if: ? You are a current smoker who has smoked for at least 30 years. ? You are a former smoker who has quit within the past 15 years.  Talk to your health care provider about your screening options, when you should start screening, and how often you should be screened.  Colorectal Cancer  Routine colorectal cancer screening usually begins at 50 years of age and should be repeated every 5-10 years until you are 50 years old. You may need to be screened more often if early forms of precancerous polyps or small growths are found. Your health care  provider may recommend screening at an earlier age if you have risk factors for colon cancer.  Your health care provider may recommend using home test kits to check for hidden blood in the stool.  A small camera at the end of a tube can be used to examine your colon (sigmoidoscopy or colonoscopy). This checks for the earliest forms of colorectal cancer.  Prostate and Testicular Cancer  Depending on your age and overall health, your health care provider may do certain tests to screen for prostate and testicular cancer.  Talk to your health care provider about any symptoms or concerns you have about testicular or prostate cancer.  Skin Cancer  Check your skin from head to toe regularly.  Tell your health care provider about any new moles or changes in moles, especially if: ? There is a change in a mole's size, shape, or color. ? You have a mole that is larger than a pencil eraser.  Always use sunscreen. Apply sunscreen liberally and repeat throughout the day.  Protect yourself by wearing long sleeves, pants, a wide-brimmed hat, and sunglasses when outside.  What should I know about heart disease, diabetes, and high blood pressure?  If you are 18-39 years of age, have your blood pressure checked every 3-5 years. If you are 40 years of age or older, have your blood pressure checked every year. You should have your blood pressure measured twice-once when you are at a hospital or clinic, and once   when you are not at a hospital or clinic. Record the average of the two measurements. To check your blood pressure when you are not at a hospital or clinic, you can use: ? An automated blood pressure machine at a pharmacy. ? A home blood pressure monitor.  Talk to your health care provider about your target blood pressure.  If you are between 45-79 years old, ask your health care provider if you should take aspirin to prevent heart disease.  Have regular diabetes screenings by checking your  fasting blood sugar level. ? If you are at a normal weight and have a low risk for diabetes, have this test once every three years after the age of 45. ? If you are overweight and have a high risk for diabetes, consider being tested at a younger age or more often.  A one-time screening for abdominal aortic aneurysm (AAA) by ultrasound is recommended for men aged 65-75 years who are current or former smokers. What should I know about preventing infection? Hepatitis B If you have a higher risk for hepatitis B, you should be screened for this virus. Talk with your health care provider to find out if you are at risk for hepatitis B infection. Hepatitis C Blood testing is recommended for:  Everyone born from 1945 through 1965.  Anyone with known risk factors for hepatitis C.  Sexually Transmitted Diseases (STDs)  You should be screened each year for STDs including gonorrhea and chlamydia if: ? You are sexually active and are younger than 50 years of age. ? You are older than 50 years of age and your health care provider tells you that you are at risk for this type of infection. ? Your sexual activity has changed since you were last screened and you are at an increased risk for chlamydia or gonorrhea. Ask your health care provider if you are at risk.  Talk with your health care provider about whether you are at high risk of being infected with HIV. Your health care provider may recommend a prescription medicine to help prevent HIV infection.  What else can I do?  Schedule regular health, dental, and eye exams.  Stay current with your vaccines (immunizations).  Do not use any tobacco products, such as cigarettes, chewing tobacco, and e-cigarettes. If you need help quitting, ask your health care provider.  Limit alcohol intake to no more than 2 drinks per day. One drink equals 12 ounces of beer, 5 ounces of wine, or 1 ounces of hard liquor.  Do not use street drugs.  Do not share  needles.  Ask your health care provider for help if you need support or information about quitting drugs.  Tell your health care provider if you often feel depressed.  Tell your health care provider if you have ever been abused or do not feel safe at home. This information is not intended to replace advice given to you by your health care provider. Make sure you discuss any questions you have with your health care provider. Document Released: 02/22/2008 Document Revised: 04/24/2016 Document Reviewed: 05/30/2015 Elsevier Interactive Patient Education  2018 [employer]   Colonoscopy, Adult A colonoscopy is an exam to look at the entire large intestine. During the exam, a lubricated, bendable tube is inserted into the anus and then passed into the rectum, colon, and other parts of the large intestine. A colonoscopy is often done as a part of normal colorectal screening or in response to certain symptoms, such as anemia, persistent   diarrhea, abdominal pain, and blood in the stool. The exam can help screen for and diagnose medical problems, including:  Tumors.  Polyps.  Inflammation.  Areas of bleeding.  Tell a health care provider about:  Any allergies you have.  All medicines you are taking, including vitamins, herbs, eye drops, creams, and over-the-counter medicines.  Any problems you or family members have had with anesthetic medicines.  Any blood disorders you have.  Any surgeries you have had.  Any medical conditions you have.  Any problems you have had passing stool. What are the risks? Generally, this is a safe procedure. However, problems may occur, including:  Bleeding.  A tear in the intestine.  A reaction to medicines given during the exam.  Infection (rare).  What happens before the procedure? Eating and drinking restrictions Follow instructions from your health care provider about eating and drinking, which may include:  A few days before the  procedure - follow a low-fiber diet. Avoid nuts, seeds, dried fruit, raw fruits, and vegetables.  1-3 days before the procedure - follow a clear liquid diet. Drink only clear liquids, such as clear broth or bouillon, black coffee or tea, clear juice, clear soft drinks or sports drinks, gelatin dessert, and popsicles. Avoid any liquids that contain red or purple dye.  On the day of the procedure - do not eat or drink anything during the 2 hours before the procedure, or within the time period that your health care provider recommends.  Bowel prep If you were prescribed an oral bowel prep to clean out your colon:  Take it as told by your health care provider. Starting the day before your procedure, you will need to drink a large amount of medicated liquid. The liquid will cause you to have multiple loose stools until your stool is almost clear or light green.  If your skin or anus gets irritated from diarrhea, you may use these to relieve the irritation: ? Medicated wipes, such as adult wet wipes with aloe and vitamin E. ? A skin soothing-product like petroleum jelly.  If you vomit while drinking the bowel prep, take a break for up to 60 minutes and then begin the bowel prep again. If vomiting continues and you cannot take the bowel prep without vomiting, call your health care provider.  General instructions  Ask your health care provider about changing or stopping your regular medicines. This is especially important if you are taking diabetes medicines or blood thinners.  Plan to have someone take you home from the hospital or clinic. What happens during the procedure?  An IV tube may be inserted into one of your veins.  You will be given medicine to help you relax (sedative).  To reduce your risk of infection: ? Your health care team will wash or sanitize their hands. ? Your anal area will be washed with soap.  You will be asked to lie on your side with your knees bent.  Your health  care provider will lubricate a long, thin, flexible tube. The tube will have a camera and a light on the end.  The tube will be inserted into your anus.  The tube will be gently eased through your rectum and colon.  Air will be delivered into your colon to keep it open. You may feel some pressure or cramping.  The camera will be used to take images during the procedure.  A small tissue sample may be removed from your body to be examined under a microscope (  biopsy). If any potential problems are found, the tissue will be sent to a lab for testing.  If small polyps are found, your health care provider may remove them and have them checked for cancer cells.  The tube that was inserted into your anus will be slowly removed. The procedure may vary among health care providers and hospitals. What happens after the procedure?  Your blood pressure, heart rate, breathing rate, and blood oxygen level will be monitored until the medicines you were given have worn off.  Do not drive for 24 hours after the exam.  You may have a small amount of blood in your stool.  You may pass gas and have mild abdominal cramping or bloating due to the air that was used to inflate your colon during the exam.  It is up to you to get the results of your procedure. Ask your health care provider, or the department performing the procedure, when your results will be ready. This information is not intended to replace advice given to you by your health care provider. Make sure you discuss any questions you have with your health care provider. Document Released: 08/23/2000 Document Revised: 06/26/2016 Document Reviewed: 11/07/2015 Elsevier Interactive Patient Education  2018 [employer] Inc[employer]  

## 2017-08-11 NOTE — Progress Notes (Signed)
Patient ID: Donald NunneryDerek Castorena, male   DOB: 1965/09/29, 51 y.o.   MRN: [id number]  Subjective:    CC: CPE  HPI: 51 year old male who is here today for complete physical exam.  He did want to let me know that he had a colonoscopy done at the [facility] this past year because he saw some blood in his stool at one point.  That has not been persistent.  He says it was a normal scope he says they did remove 2 polyps that were normal.  He also has some skin tags he would like removed today.  He has a couple near his right eye and one on posterior neck that is bothering him.  His eye exam was done at the [facility].  Past medical history, Surgical history, Family history not pertinant except as noted below, Social history, Allergies, and medications have been entered into the medical record, reviewed, and corrections made.   Review of Systems: No fevers, chills, night sweats, weight loss, chest pain, or shortness of breath.   Objective:    Physical Exam  Constitutional: He is oriented to person, place, and time. He appears well-developed and well-nourished.  HENT:  Head: Normocephalic and atraumatic.  Right Ear: External ear normal.  Left Ear: External ear normal.  Nose: Nose normal.  Mouth/Throat: Oropharynx is clear and moist.  Eyes: Conjunctivae and EOM are normal. Pupils are equal, round, and reactive to light.  Neck: Normal range of motion. Neck supple. No thyromegaly present.  Cardiovascular: Normal rate, regular rhythm, normal heart sounds and intact distal pulses.  Pulmonary/Chest: Effort normal and breath sounds normal.  Abdominal: Soft. Bowel sounds are normal. He exhibits no distension and no mass. There is no tenderness. There is no rebound and no guarding.  Musculoskeletal: Normal range of motion.  Lymphadenopathy:    He has no cervical adenopathy.  Neurological: He is alert and oriented to person, place, and time. He has normal reflexes.  Skin: Skin is warm and dry.  The small skin tags at the  corner of the right eye and 2 on posterior neck.    Psychiatric: He has a normal mood and affect. His behavior is normal. Judgment and thought content normal.      Impression and Recommendations:   Keep up a regular exercise program and make sure you are eating a healthy diet Try to eat 4 servings of dairy a day, or if you are lactose intolerant take a calcium with vitamin D daily.  Your vaccines are up to date.  For labs. Will call for copy of colonoscopy.  Anemia, unspecified - we will can do some additional workup last year and on not go for the extra blood work.  Will order it this year.  He has had a negative colonoscopy which is very reassuring.    Skin Tag Removal Procedure Note  Pre-operative Diagnosis: Classic skin tags (acrochordon)  Post-operative Diagnosis: Classic skin tags (acrochordon)  Locations: 2 on corner of right eye, and 2 on posterior neck  Indications: irritation  Anesthesia: not required    Procedure Details  The risks (including bleeding and infection) and benefits of the procedure and Verbal informed consent obtained. Using sterile iris scissors, multiple skin tags were snipped off at their bases after cleansing with Betadine.  Bleeding was controlled by pressure.   Findings: Pathognomonic benign lesions  not sent for pathological exam.  Condition: Stable  Complications: none.  Plan: 1. Instructed to keep the wounds dry and covered for 24-48h and clean  thereafter. 2. Warning signs of infection were reviewed.   3. Recommended that the patient use OTC acetaminophen as needed for pain.  4. Return as needed.

## 2017-08-14 LAB — COMPLETE METABOLIC PANEL WITH GFR
AG RATIO: 1.6 (calc) (ref 1.0–2.5)
ALT: 19 U/L (ref 9–46)
AST: 17 U/L (ref 10–40)
Albumin: 4.2 g/dL (ref 3.6–5.1)
Alkaline phosphatase (APISO): 65 U/L (ref 40–115)
BILIRUBIN TOTAL: 0.8 mg/dL (ref 0.2–1.2)
BUN: 11 mg/dL (ref 7–25)
CHLORIDE: 102 mmol/L (ref 98–110)
CO2: 31 mmol/L (ref 20–32)
Calcium: 9.3 mg/dL (ref 8.6–10.3)
Creat: 0.95 mg/dL (ref 0.60–1.35)
GFR, EST AFRICAN AMERICAN: 108 mL/min/{1.73_m2} (ref >=60)
GFR, Est Non African American: 94 mL/min/{1.73_m2} (ref >=60)
Globulin: 2.6 g/dL (calc) (ref 1.9–3.7)
Glucose, Bld: 101 mg/dL — ABNORMAL HIGH (ref 65–99)
POTASSIUM: 4.3 mmol/L (ref 3.5–5.3)
Sodium: 138 mmol/L (ref 135–146)
Total Protein: 6.8 g/dL (ref 6.1–8.1)

## 2017-08-14 LAB — LIPID PANEL
Cholesterol: 172 mg/dL (ref <200)
HDL: 36 mg/dL — AB (ref >40)
LDL Cholesterol (Calc): 111 mg/dL (calc) — ABNORMAL HIGH
Non-HDL Cholesterol (Calc): 136 mg/dL (calc) — ABNORMAL HIGH (ref <130)
TRIGLYCERIDES: 136 mg/dL (ref <150)
Total CHOL/HDL Ratio: 4.8 (calc) (ref <5.0)

## 2017-08-14 LAB — TEST AUTHORIZATION

## 2017-08-14 LAB — CBC
HEMATOCRIT: 35.7 % — AB (ref 38.5–50.0)
HEMOGLOBIN: 11.9 g/dL — AB (ref 13.2–17.1)
MCH: 28.1 pg (ref 27.0–33.0)
MCHC: 33.3 g/dL (ref 32.0–36.0)
MCV: 84.4 fL (ref 80.0–100.0)
MPV: 9.2 fL (ref 7.5–12.5)
Platelets: 390 10*3/uL (ref 140–400)
RBC: 4.23 10*6/uL (ref 4.20–5.80)
RDW: 14.8 % (ref 11.0–15.0)
WBC: 5.8 10*3/uL (ref 3.8–10.8)

## 2017-08-14 LAB — RETICULOCYTES
ABS Retic: 42300 cells/uL (ref 25000–9000)
Retic Ct Pct: 1 %

## 2017-08-14 LAB — IRON, TOTAL/TOTAL IRON BINDING CAP
%SAT: 26 % (ref 15–60)
Iron: 79 ug/dL (ref 50–180)
TIBC: 309 ug/dL (ref 250–425)

## 2017-08-14 LAB — TEST AUTHORIZATION 2

## 2017-08-14 LAB — HAPTOGLOBIN: HAPTOGLOBIN: 77 mg/dL (ref 43–212)

## 2017-08-14 LAB — PSA: PSA: 0.9 ng/mL (ref <=4.0)

## 2017-08-14 LAB — PATHOLOGIST SMEAR REVIEW

## 2017-08-14 LAB — LACTATE DEHYDROGENASE: LDH: 159 U/L (ref 100–220)

## 2017-08-14 LAB — HEMOGLOBIN A1C W/OUT EAG: Hgb A1c MFr Bld: 6.4 % of total Hgb — ABNORMAL HIGH (ref <5.7)

## 2017-08-14 LAB — FERRITIN: FERRITIN: 93 ng/mL (ref 20–380)

## 2017-08-23 ENCOUNTER — Other Ambulatory Visit: Payer: Self-pay | Admitting: Family Medicine

## 2017-09-12 ENCOUNTER — Encounter: Payer: Self-pay | Admitting: Family Medicine

## 2017-09-12 LAB — HEMOGLOBIN
HEMOGLOBIN A - HGBRFX: 97.1
HEMOGLOBIN A2 - HGBRFX: 2.9
Hemoglobin F - HGBRFX: 0

## 2017-09-12 LAB — HAPTOGLOBIN
FERRITIN: 90.4
Haptoglobin: 78
IRON SATURATION: 11.3
Iron: 39
LDH: 187
TIBC: 344
TRANSFERRIN: 246

## 2017-09-18 ENCOUNTER — Encounter: Payer: Self-pay | Admitting: Family Medicine

## 2017-10-23 ENCOUNTER — Other Ambulatory Visit: Payer: Self-pay | Admitting: Family Medicine

## 2017-12-08 DIAGNOSIS — Z125 Encounter for screening for malignant neoplasm of prostate: Secondary | ICD-10-CM | POA: Diagnosis not present

## 2017-12-08 DIAGNOSIS — Z833 Family history of diabetes mellitus: Secondary | ICD-10-CM | POA: Diagnosis not present

## 2017-12-08 DIAGNOSIS — E669 Obesity, unspecified: Secondary | ICD-10-CM | POA: Diagnosis not present

## 2017-12-08 DIAGNOSIS — Z131 Encounter for screening for diabetes mellitus: Secondary | ICD-10-CM | POA: Diagnosis not present

## 2018-01-22 ENCOUNTER — Other Ambulatory Visit: Payer: Self-pay | Admitting: Family Medicine

## 2018-02-09 ENCOUNTER — Ambulatory Visit: Payer: BLUE CROSS/BLUE SHIELD | Admitting: Family Medicine

## 2018-02-25 ENCOUNTER — Other Ambulatory Visit: Payer: Self-pay | Admitting: Family Medicine

## 2018-03-09 ENCOUNTER — Ambulatory Visit: Payer: BLUE CROSS/BLUE SHIELD | Admitting: Family Medicine

## 2018-04-29 ENCOUNTER — Other Ambulatory Visit: Payer: Self-pay | Admitting: Family Medicine

## 2018-04-29 ENCOUNTER — Other Ambulatory Visit: Payer: Self-pay | Admitting: *Deleted

## 2018-05-28 ENCOUNTER — Other Ambulatory Visit: Payer: Self-pay | Admitting: Family Medicine

## 2018-06-05 ENCOUNTER — Other Ambulatory Visit: Payer: Self-pay | Admitting: Family Medicine

## 2018-06-26 ENCOUNTER — Other Ambulatory Visit: Payer: Self-pay | Admitting: Family Medicine

## 2018-07-08 ENCOUNTER — Other Ambulatory Visit: Payer: Self-pay | Admitting: Family Medicine

## 2018-08-09 ENCOUNTER — Other Ambulatory Visit: Payer: Self-pay | Admitting: Family Medicine

## 2018-08-27 ENCOUNTER — Other Ambulatory Visit: Payer: Self-pay | Admitting: Family Medicine

## 2018-09-04 ENCOUNTER — Other Ambulatory Visit: Payer: Self-pay | Admitting: *Deleted

## 2018-09-04 MED ORDER — AMLODIPINE BESYLATE 10 MG PO TABS: 10.0000 mg | ORAL_TABLET | Freq: Every day | ORAL | 0 refills | Status: DC

## 2018-09-04 MED ORDER — METOPROLOL SUCCINATE ER 50 MG PO TB24: 50.0000 mg | ORAL_TABLET | Freq: Every day | ORAL | 0 refills | Status: DC

## 2018-09-04 MED ORDER — LOSARTAN POTASSIUM 100 MG PO TABS: 100.0000 mg | ORAL_TABLET | Freq: Every day | ORAL | 0 refills | Status: DC

## 2018-10-01 ENCOUNTER — Other Ambulatory Visit: Payer: Self-pay | Admitting: Family Medicine

## 2018-10-26 ENCOUNTER — Encounter: Payer: BLUE CROSS/BLUE SHIELD | Admitting: Family Medicine

## 2018-10-29 ENCOUNTER — Other Ambulatory Visit: Payer: Self-pay | Admitting: Family Medicine

## 2018-11-09 ENCOUNTER — Encounter: Payer: Self-pay | Admitting: Family Medicine

## 2018-11-09 ENCOUNTER — Ambulatory Visit (INDEPENDENT_AMBULATORY_CARE_PROVIDER_SITE_OTHER): Payer: BLUE CROSS/BLUE SHIELD | Admitting: Family Medicine

## 2018-11-09 VITALS — BP 160/80 | HR 61 | Ht 70.0 in | Wt 313.0 lb

## 2018-11-09 DIAGNOSIS — Z91030 Bee allergy status: Secondary | ICD-10-CM | POA: Diagnosis not present

## 2018-11-09 DIAGNOSIS — Z0000 Encounter for general adult medical examination without abnormal findings: Secondary | ICD-10-CM

## 2018-11-09 DIAGNOSIS — Z125 Encounter for screening for malignant neoplasm of prostate: Secondary | ICD-10-CM | POA: Diagnosis not present

## 2018-11-09 DIAGNOSIS — Z23 Encounter for immunization: Secondary | ICD-10-CM | POA: Diagnosis not present

## 2018-11-09 MED ORDER — ATORVASTATIN CALCIUM 80 MG PO TABS: ORAL_TABLET | ORAL | 3 refills | Status: DC

## 2018-11-09 MED ORDER — AMLODIPINE BESYLATE 10 MG PO TABS: 10.0000 mg | ORAL_TABLET | Freq: Every day | ORAL | 1 refills | Status: DC

## 2018-11-09 MED ORDER — LOSARTAN POTASSIUM 100 MG PO TABS: 100.0000 mg | ORAL_TABLET | Freq: Every day | ORAL | 1 refills | Status: DC

## 2018-11-09 MED ORDER — METOPROLOL SUCCINATE ER 50 MG PO TB24: 50.0000 mg | ORAL_TABLET | Freq: Every day | ORAL | 1 refills | Status: DC

## 2018-11-09 NOTE — Patient Instructions (Signed)

## 2018-11-09 NOTE — Progress Notes (Addendum)
CPD- Established Patient Office Visit  Subjective:  Patient ID: Donald Olson, male    DOB: [birth year]  Age: 53 y.o. MRN: [id number]  CC:  Chief Complaint  Patient presents with  . Annual Exam    HPI Donald Olson presents for CPE. He is doing well. No exercise currently.    Says gets stung by bees frequently and feels like his reaction to them has been getting worse.  Says this past year was stung near his face and has some neck swelling.    Past Medical History:  Diagnosis Date  . Hyperlipidemia   . Hypertension   . Obesity     Past Surgical History:  Procedure Laterality Date  . APPENDECTOMY  20  . EYE SURGERY    . patellar tendon surgery     bilateral    Family History  Problem Relation Age of Onset  . Cancer Father 70       pancreatic  . Hypertension Father   . Hypertension Brother   . Diabetes Unknown        cousin/uncle/ERSD    Social History   Socioeconomic History  . Marital status: Single    Spouse name: Not on file  . Number of children: Not on file  . Years of education: Not on file  . Highest education level: Not on file  Occupational History  . Occupation: Paediatric nurse   Social Needs  . Financial resource strain: Not on file  . Food insecurity:    Worry: Not on file    Inability: Not on file  . Transportation needs:    Medical: Not on file    Non-medical: Not on file  Tobacco Use  . Smoking status: Never Smoker  . Smokeless tobacco: Never Used  Substance and Sexual Activity  . Alcohol use: No    Alcohol/week: 0.0 standard drinks  . Drug use: No  . Sexual activity: Not on file    Comment: owns barber shop, and does leaf removal, married,daughter, wants to exercise.  Lifestyle  . Physical activity:    Days per week: Not on file    Minutes per session: Not on file  . Stress: Not on file  Relationships  . Social connections:    Talks on phone: Not on file    Gets together: Not on file    Attends religious service: Not on file   Active member of club or organization: Not on file    Attends meetings of clubs or organizations: Not on file    Relationship status: Not on file  . Intimate partner violence:    Fear of current or ex partner: Not on file    Emotionally abused: Not on file    Physically abused: Not on file    Forced sexual activity: Not on file  Other Topics Concern  . Not on file  Social History Narrative  . Not on file    Outpatient Medications Prior to Visit  Medication Sig Dispense Refill  . EPINEPHrine 0.3 mg/0.3 mL IJ SOAJ injection Inject 0.3 mg into the muscle as needed.    Marland Kitchen amLODipine (NORVASC) 10 MG tablet Take 1 tablet (10 mg total) by mouth daily. MUST keep upcoming appt.. no refills if not kept 30 tablet 0  . atorvastatin (LIPITOR) 80 MG tablet TAKE 1 TABLET(80 MG) BY MOUTH DAILY AT 6 PM 90 tablet 3  . losartan (COZAAR) 100 MG tablet Take 1 tablet (100 mg total) by mouth daily. MUST keep upcoming appt.. no  refills if not kept 30 tablet 0  . metoprolol succinate (TOPROL-XL) 50 MG 24 hr tablet Take 1 tablet (50 mg total) by mouth daily. MUST keep upcoming appt.. no refills if not kept 30 tablet 0   No facility-administered medications prior to visit.     Allergies  Allergen Reactions  . Bee Venom   . Hctz [Hydrochlorothiazide] Swelling and Rash    ROS Review of Systems    Objective:    Physical Exam  BP (!) 166/84   Pulse (!) 59   Ht 5\' 10"  (1.778 m)   Wt (!) 313 lb (142 kg)   SpO2 98%   BMI 44.91 kg/m  Wt Readings from Last 3 Encounters:  11/09/18 (!) 313 lb (142 kg)  08/11/17 (!) 315 lb (142.9 kg)  06/16/17 (!) 316 lb (143.3 kg)     There are no preventive care reminders to display for this patient.  There are no preventive care reminders to display for this patient.  Lab Results  Component Value Date   TSH 1.97 06/23/2017   Lab Results  Component Value Date   WBC 5.8 08/11/2017   HGB 11.9 (L) 08/11/2017   HCT 35.7 (L) 08/11/2017   MCV 84.4 08/11/2017    PLT 390 08/11/2017   Lab Results  Component Value Date   NA 138 08/11/2017   K 4.3 08/11/2017   CO2 31 08/11/2017   GLUCOSE 101 (H) 08/11/2017   BUN 11 08/11/2017   CREATININE 0.95 08/11/2017   BILITOT 0.8 08/11/2017   ALKPHOS 79 06/23/2017   AST 17 08/11/2017   ALT 19 08/11/2017   PROT 6.8 08/11/2017   ALBUMIN 4.4 05/06/2016   CALCIUM 9.3 08/11/2017   Lab Results  Component Value Date   CHOL 172 08/11/2017   Lab Results  Component Value Date   HDL 36 (L) 08/11/2017   Lab Results  Component Value Date   LDLCALC 111 (H) 08/11/2017   Lab Results  Component Value Date   TRIG 136 08/11/2017   Lab Results  Component Value Date   CHOLHDL 4.8 08/11/2017   Lab Results  Component Value Date   HGBA1C 6.4 (H) 08/11/2017      Assessment & Plan:   Problem List Items Addressed This Visit      Other   Bee sting allergy    Other Visit Diagnoses    Routine general medical examination at a health care facility    -  Primary   Relevant Orders   COMPLETE METABOLIC PANEL WITH GFR   Lipid Panel w/reflex Direct LDL   PSA   Screening PSA (prostate specific antigen)       Relevant Orders   PSA     Keep up a regular exercise program and make sure you are eating a healthy diet Try to eat 4 servings of dairy a day, or if you are lactose intolerant take a calcium with vitamin D daily.  Your vaccines are up to date.    Meds ordered this encounter  Medications  . amLODipine (NORVASC) 10 MG tablet    Sig: Take 1 tablet (10 mg total) by mouth daily.    Dispense:  90 tablet    Refill:  1  . atorvastatin (LIPITOR) 80 MG tablet    Sig: TAKE 1 TABLET(80 MG) BY MOUTH DAILY AT BEDTIME    Dispense:  90 tablet    Refill:  3  . losartan (COZAAR) 100 MG tablet    Sig: Take 1 tablet (  100 mg total) by mouth daily.    Dispense:  90 tablet    Refill:  1  . metoprolol succinate (TOPROL-XL) 50 MG 24 hr tablet    Sig: Take 1 tablet (50 mg total) by mouth daily.    Dispense:  90  tablet    Refill:  1    Follow-up: Return in about 6 months (around 05/12/2019) for BP check up.    Nani Gasser, MD

## 2018-11-10 LAB — COMPLETE METABOLIC PANEL WITH GFR
AG RATIO: 1.7 (calc) (ref 1.0–2.5)
ALT: 20 U/L (ref 9–46)
AST: 18 U/L (ref 10–35)
Albumin: 4.3 g/dL (ref 3.6–5.1)
Alkaline phosphatase (APISO): 65 U/L (ref 35–144)
BUN: 13 mg/dL (ref 7–25)
CO2: 28 mmol/L (ref 20–32)
Calcium: 9.4 mg/dL (ref 8.6–10.3)
Chloride: 105 mmol/L (ref 98–110)
Creat: 0.98 mg/dL (ref 0.70–1.33)
GFR, Est African American: 103 mL/min/{1.73_m2} (ref >=60)
GFR, Est Non African American: 89 mL/min/{1.73_m2} (ref >=60)
Globulin: 2.6 g/dL (calc) (ref 1.9–3.7)
Glucose, Bld: 101 mg/dL — ABNORMAL HIGH (ref 65–99)
POTASSIUM: 4.5 mmol/L (ref 3.5–5.3)
Sodium: 140 mmol/L (ref 135–146)
Total Bilirubin: 0.6 mg/dL (ref 0.2–1.2)
Total Protein: 6.9 g/dL (ref 6.1–8.1)

## 2018-11-10 LAB — LIPID PANEL W/REFLEX DIRECT LDL
Cholesterol: 163 mg/dL (ref <200)
HDL: 36 mg/dL — ABNORMAL LOW (ref >=40)
LDL Cholesterol (Calc): 109 mg/dL (calc) — ABNORMAL HIGH
Non-HDL Cholesterol (Calc): 127 mg/dL (calc) (ref <130)
Total CHOL/HDL Ratio: 4.5 (calc) (ref <5.0)
Triglycerides: 88 mg/dL (ref <150)

## 2018-11-10 LAB — PSA: PSA: 1 ng/mL (ref <=4.0)

## 2018-12-16 DIAGNOSIS — L720 Epidermal cyst: Secondary | ICD-10-CM | POA: Diagnosis not present

## 2019-05-03 ENCOUNTER — Other Ambulatory Visit: Payer: Self-pay | Admitting: Family Medicine

## 2019-05-11 ENCOUNTER — Other Ambulatory Visit: Payer: Self-pay

## 2019-05-11 ENCOUNTER — Encounter: Payer: Self-pay | Admitting: Family Medicine

## 2019-05-11 ENCOUNTER — Other Ambulatory Visit: Payer: Self-pay | Admitting: *Deleted

## 2019-05-11 ENCOUNTER — Ambulatory Visit (INDEPENDENT_AMBULATORY_CARE_PROVIDER_SITE_OTHER): Payer: BLUE CROSS/BLUE SHIELD | Admitting: Family Medicine

## 2019-05-11 ENCOUNTER — Ambulatory Visit: Payer: BLUE CROSS/BLUE SHIELD | Admitting: Family Medicine

## 2019-05-11 VITALS — BP 152/64 | HR 67 | Ht 70.0 in | Wt 309.0 lb

## 2019-05-11 DIAGNOSIS — Z0000 Encounter for general adult medical examination without abnormal findings: Secondary | ICD-10-CM

## 2019-05-11 DIAGNOSIS — I10 Essential (primary) hypertension: Secondary | ICD-10-CM

## 2019-05-11 DIAGNOSIS — J029 Acute pharyngitis, unspecified: Secondary | ICD-10-CM

## 2019-05-11 DIAGNOSIS — R7301 Impaired fasting glucose: Secondary | ICD-10-CM | POA: Diagnosis not present

## 2019-05-11 DIAGNOSIS — K529 Noninfective gastroenteritis and colitis, unspecified: Secondary | ICD-10-CM | POA: Diagnosis not present

## 2019-05-11 MED ORDER — METOPROLOL SUCCINATE ER 100 MG PO TB24: 100.0000 mg | ORAL_TABLET | Freq: Every day | ORAL | 1 refills | Status: DC

## 2019-05-11 NOTE — Progress Notes (Signed)
Established Patient Office Visit  Subjective:  Patient ID: Donald Olson, male    DOB: Jun 19, 1966  Age: 53 y.o. MRN: [id number]  CC:  Chief Complaint  Patient presents with  . Sore Throat    HPI Donald Olson presents for vomiing over the weekend.  Lasted one days. He says he finally passed gas on Sunday and felt much better. His throat has been irritated and he has had a gravely voice since then.    Hypertension- Pt denies chest pain, SOB, dizziness, or heart palpitations. Taking meds as directed w/o problems.  Denies medication side effects.  He says he has been trying to watch his salt intake more recently.  Impaired fasting glucose-no increased thirst or urination. No symptoms consistent with hypoglycemia.   Past Medical History:  Diagnosis Date  . Hyperlipidemia   . Hypertension   . Obesity     Past Surgical History:  Procedure Laterality Date  . APPENDECTOMY  21  . EYE SURGERY    . patellar tendon surgery     bilateral    Family History  Problem Relation Age of Onset  . Cancer Father 30       pancreatic  . Hypertension Father   . Hypertension Brother   . Diabetes Unknown        cousin/uncle/ERSD    Social History   Socioeconomic History  . Marital status: Single    Spouse name: Not on file  . Number of children: Not on file  . Years of education: Not on file  . Highest education level: Not on file  Occupational History  . Occupation: [employer]  . Financial resource strain: Not on file  . Food insecurity    Worry: Not on file    Inability: Not on file  . Transportation needs    Medical: Not on file    Non-medical: Not on file  Tobacco Use  . Smoking status: Never Smoker  . Smokeless tobacco: Never Used  Substance and Sexual Activity  . Alcohol use: No    Alcohol/week: 0.0 standard drinks  . Drug use: No  . Sexual activity: Not on file    Comment: owns barber shop, and does leaf removal, married,daughter, wants to exercise.   Lifestyle  . Physical activity    Days per week: Not on file    Minutes per session: Not on file  . Stress: Not on file  Relationships  . Social Herbalist on phone: Not on file    Gets together: Not on file    Attends religious service: Not on file    Active member of club or organization: Not on file    Attends meetings of clubs or organizations: Not on file    Relationship status: Not on file  . Intimate partner violence    Fear of current or ex partner: Not on file    Emotionally abused: Not on file    Physically abused: Not on file    Forced sexual activity: Not on file  Other Topics Concern  . Not on file  Social History Narrative  . Not on file    Outpatient Medications Prior to Visit  Medication Sig Dispense Refill  . amLODipine (NORVASC) 10 MG tablet TAKE 1 TABLET BY MOUTH DAILY 90 tablet 1  . atorvastatin (LIPITOR) 80 MG tablet TAKE 1 TABLET(80 MG) BY MOUTH DAILY AT BEDTIME 90 tablet 3  . EPINEPHrine 0.3 mg/0.3 mL IJ SOAJ injection Inject 0.3  mg into the muscle as needed.    Marland Kitchen. losartan (COZAAR) 100 MG tablet TAKE 1 TABLET BY MOUTH DAILY 90 tablet 1  . metoprolol succinate (TOPROL-XL) 50 MG 24 hr tablet TAKE 1 TABLET( 50 MG TOTAL) BY MOUTH DAILY 90 tablet 1   No facility-administered medications prior to visit.     Allergies  Allergen Reactions  . Bee Venom   . Hctz [Hydrochlorothiazide] Swelling and Rash    ROS Review of Systems    Objective:    Physical Exam  Constitutional: He is oriented to person, place, and time. He appears well-developed and well-nourished.  HENT:  Head: Normocephalic and atraumatic.  Cardiovascular: Normal rate, regular rhythm and normal heart sounds.  Pulmonary/Chest: Effort normal and breath sounds normal.  Neurological: He is alert and oriented to person, place, and time.  Skin: Skin is warm and dry.  Psychiatric: He has a normal mood and affect. His behavior is normal.    BP (!) 152/64   Pulse 67   Ht 5\' 10"   (1.778 m)   Wt (!) 309 lb (140.2 kg)   SpO2 100%   BMI 44.34 kg/m  Wt Readings from Last 3 Encounters:  05/11/19 (!) 309 lb (140.2 kg)  11/09/18 (!) 313 lb (142 kg)  08/11/17 (!) 315 lb (142.9 kg)     Health Maintenance Due  Topic Date Due  . INFLUENZA VACCINE  04/10/2019    There are no preventive care reminders to display for this patient.  Lab Results  Component Value Date   TSH 1.97 06/23/2017   Lab Results  Component Value Date   WBC 5.8 08/11/2017   HGB 11.9 (L) 08/11/2017   HCT 35.7 (L) 08/11/2017   MCV 84.4 08/11/2017   PLT 390 08/11/2017   Lab Results  Component Value Date   NA 140 11/09/2018   K 4.5 11/09/2018   CO2 28 11/09/2018   GLUCOSE 101 (H) 11/09/2018   BUN 13 11/09/2018   CREATININE 0.98 11/09/2018   BILITOT 0.6 11/09/2018   ALKPHOS 79 06/23/2017   AST 18 11/09/2018   ALT 20 11/09/2018   PROT 6.9 11/09/2018   ALBUMIN 4.4 05/06/2016   CALCIUM 9.4 11/09/2018   Lab Results  Component Value Date   CHOL 163 11/09/2018   Lab Results  Component Value Date   HDL 36 (L) 11/09/2018   Lab Results  Component Value Date   LDLCALC 109 (H) 11/09/2018   Lab Results  Component Value Date   TRIG 88 11/09/2018   Lab Results  Component Value Date   CHOLHDL 4.5 11/09/2018   Lab Results  Component Value Date   HGBA1C 6.4 (H) 08/11/2017      Assessment & Plan:   Problem List Items Addressed This Visit      Cardiovascular and Mediastinum   ESSENTIAL HYPERTENSION, BENIGN - Primary    Not well controlled today though improved from previous blood pressure.  Encouraged him to continue to work on low-salt diet in addition to healthy diet and weight loss.  Increase metoprolol to 100 mg.  Encouraged him to get his own blood pressure cuff for blood and try to check his blood pressure at home.      Relevant Medications   metoprolol succinate (TOPROL-XL) 100 MG 24 hr tablet     Endocrine   IFG (impaired fasting glucose)    Go for labs today to  check hemoglobin A1c.  Borderline diabetes back in December.  Again just encouraged him to work  on weight loss and healthy diet.       Other Visit Diagnoses    Sore throat       Acute gastroenteritis         Sounds like he likely had an episode of acute gastroenteritis he had diarrhea and vomiting for several hours over 1 night he is feeling much better since then.  Though still has a residual sore throat likely from the acid from vomiting.  He says it does feel better today so just encouraged him to keep an eye on that over the next couple days and if not improving then please let me know.  Meds ordered this encounter  Medications  . metoprolol succinate (TOPROL-XL) 100 MG 24 hr tablet    Sig: Take 1 tablet (100 mg total) by mouth daily. Take with or immediately following a meal.    Dispense:  90 tablet    Refill:  1    Follow-up: Return in about 6 months (around 11/10/2019) for Wellness Exam .    Nani Gasser, MD

## 2019-05-11 NOTE — Assessment & Plan Note (Signed)
Go for labs today to check hemoglobin A1c.  Borderline diabetes back in December.  Again just encouraged him to work on weight loss and healthy diet.

## 2019-05-11 NOTE — Assessment & Plan Note (Addendum)
Not well controlled today though improved from previous blood pressure.  Encouraged him to continue to work on low-salt diet in addition to healthy diet and weight loss.  Increase metoprolol to 100 mg.  Encouraged him to get his own blood pressure cuff for blood and try to check his blood pressure at home.

## 2019-05-12 LAB — BASIC METABOLIC PANEL WITH GFR
BUN: 14 mg/dL (ref 7–25)
CO2: 28 mmol/L (ref 20–32)
Calcium: 9.6 mg/dL (ref 8.6–10.3)
Chloride: 104 mmol/L (ref 98–110)
Creat: 1.06 mg/dL (ref 0.70–1.33)
GFR, Est African American: 94 mL/min/{1.73_m2} (ref >=60)
GFR, Est Non African American: 81 mL/min/{1.73_m2} (ref >=60)
Glucose, Bld: 105 mg/dL — ABNORMAL HIGH (ref 65–99)
Potassium: 4 mmol/L (ref 3.5–5.3)
Sodium: 140 mmol/L (ref 135–146)

## 2019-05-12 LAB — HEMOGLOBIN A1C
Hgb A1c MFr Bld: 6.4 % of total Hgb — ABNORMAL HIGH (ref <5.7)
Mean Plasma Glucose: 137 (calc)
eAG (mmol/L): 7.6 (calc)

## 2019-06-23 DIAGNOSIS — E119 Type 2 diabetes mellitus without complications: Secondary | ICD-10-CM | POA: Diagnosis not present

## 2019-06-23 DIAGNOSIS — I10 Essential (primary) hypertension: Secondary | ICD-10-CM | POA: Diagnosis not present

## 2019-06-23 DIAGNOSIS — G479 Sleep disorder, unspecified: Secondary | ICD-10-CM | POA: Diagnosis not present

## 2019-06-23 DIAGNOSIS — E669 Obesity, unspecified: Secondary | ICD-10-CM | POA: Diagnosis not present

## 2019-06-28 DIAGNOSIS — L0201 Cutaneous abscess of face: Secondary | ICD-10-CM | POA: Diagnosis not present

## 2019-07-19 DIAGNOSIS — Z6841 Body Mass Index (BMI) 40.0 and over, adult: Secondary | ICD-10-CM | POA: Diagnosis not present

## 2019-07-19 DIAGNOSIS — Z713 Dietary counseling and surveillance: Secondary | ICD-10-CM | POA: Diagnosis not present

## 2019-07-19 DIAGNOSIS — E119 Type 2 diabetes mellitus without complications: Secondary | ICD-10-CM | POA: Diagnosis not present

## 2019-07-19 DIAGNOSIS — E669 Obesity, unspecified: Secondary | ICD-10-CM | POA: Diagnosis not present

## 2019-08-09 DIAGNOSIS — H524 Presbyopia: Secondary | ICD-10-CM | POA: Diagnosis not present

## 2019-08-09 DIAGNOSIS — H5211 Myopia, right eye: Secondary | ICD-10-CM | POA: Diagnosis not present

## 2019-08-09 DIAGNOSIS — H52203 Unspecified astigmatism, bilateral: Secondary | ICD-10-CM | POA: Diagnosis not present

## 2019-08-09 DIAGNOSIS — E119 Type 2 diabetes mellitus without complications: Secondary | ICD-10-CM | POA: Diagnosis not present

## 2019-08-23 DIAGNOSIS — L209 Atopic dermatitis, unspecified: Secondary | ICD-10-CM | POA: Diagnosis not present

## 2019-08-24 DIAGNOSIS — H524 Presbyopia: Secondary | ICD-10-CM | POA: Diagnosis not present

## 2019-08-24 DIAGNOSIS — H5211 Myopia, right eye: Secondary | ICD-10-CM | POA: Diagnosis not present

## 2019-08-24 DIAGNOSIS — H52203 Unspecified astigmatism, bilateral: Secondary | ICD-10-CM | POA: Diagnosis not present

## 2019-09-01 DIAGNOSIS — E119 Type 2 diabetes mellitus without complications: Secondary | ICD-10-CM | POA: Diagnosis not present

## 2019-09-01 DIAGNOSIS — Z135 Encounter for screening for eye and ear disorders: Secondary | ICD-10-CM | POA: Diagnosis not present

## 2019-09-07 DIAGNOSIS — Z135 Encounter for screening for eye and ear disorders: Secondary | ICD-10-CM | POA: Diagnosis not present

## 2019-09-07 DIAGNOSIS — E119 Type 2 diabetes mellitus without complications: Secondary | ICD-10-CM | POA: Diagnosis not present

## 2019-09-17 DIAGNOSIS — Z03818 Encounter for observation for suspected exposure to other biological agents ruled out: Secondary | ICD-10-CM | POA: Diagnosis not present

## 2019-10-30 ENCOUNTER — Other Ambulatory Visit: Payer: Self-pay | Admitting: Family Medicine

## 2019-11-08 ENCOUNTER — Encounter: Payer: BLUE CROSS/BLUE SHIELD | Admitting: Family Medicine

## 2019-11-08 ENCOUNTER — Other Ambulatory Visit: Payer: Self-pay | Admitting: *Deleted

## 2019-11-08 MED ORDER — METOPROLOL SUCCINATE ER 100 MG PO TB24: 100.0000 mg | ORAL_TABLET | Freq: Every day | ORAL | 1 refills | Status: AC

## 2019-12-06 ENCOUNTER — Encounter: Payer: BC Managed Care – PPO | Admitting: Family Medicine

## 2020-04-27 ENCOUNTER — Other Ambulatory Visit: Payer: Self-pay | Admitting: Family Medicine

## 2020-06-26 DIAGNOSIS — I440 Atrioventricular block, first degree: Secondary | ICD-10-CM | POA: Diagnosis not present

## 2020-06-26 DIAGNOSIS — Z125 Encounter for screening for malignant neoplasm of prostate: Secondary | ICD-10-CM | POA: Diagnosis not present

## 2020-06-26 DIAGNOSIS — E559 Vitamin D deficiency, unspecified: Secondary | ICD-10-CM | POA: Diagnosis not present

## 2020-06-26 DIAGNOSIS — E119 Type 2 diabetes mellitus without complications: Secondary | ICD-10-CM | POA: Diagnosis not present

## 2020-06-26 DIAGNOSIS — R001 Bradycardia, unspecified: Secondary | ICD-10-CM | POA: Diagnosis not present

## 2020-06-26 DIAGNOSIS — I10 Essential (primary) hypertension: Secondary | ICD-10-CM | POA: Diagnosis not present

## 2020-06-26 DIAGNOSIS — Z23 Encounter for immunization: Secondary | ICD-10-CM | POA: Diagnosis not present

## 2020-07-18 DIAGNOSIS — Z23 Encounter for immunization: Secondary | ICD-10-CM | POA: Diagnosis not present

## 2021-04-22 ENCOUNTER — Other Ambulatory Visit: Payer: Self-pay | Admitting: Family Medicine

## 2021-04-23 NOTE — Telephone Encounter (Signed)
Please call pt and inform him that he is OVERDUE for OV and fasting labs.  No refills will be sent.

## 2021-04-23 NOTE — Telephone Encounter (Signed)
Left a message for patient to call and schedule an appt because he is overdue for office visit, and fasting labs, and we will not be able to refill his Rx until he makes an appt. - tvt

## 2021-04-23 NOTE — Telephone Encounter (Signed)
Pt called back and stated that he is getting his medications and labs done at [place]. I asked that he release his information to our office so that we can update his records. He agreed.
